# Patient Record
Sex: Female | Born: 1985 | Race: Black or African American | Hispanic: No | Marital: Single | Smoking: Former smoker
Health system: Southern US, Community
[De-identification: ages and names within clinical notes are randomized; demographics above are authoritative.]

## PROBLEM LIST (undated history)

## (undated) ENCOUNTER — Inpatient Hospital Stay (HOSPITAL_COMMUNITY): Payer: Self-pay

## (undated) DIAGNOSIS — K859 Acute pancreatitis without necrosis or infection, unspecified: Secondary | ICD-10-CM

## (undated) DIAGNOSIS — G43909 Migraine, unspecified, not intractable, without status migrainosus: Secondary | ICD-10-CM

## (undated) HISTORY — PX: CHOLECYSTECTOMY: SHX55

---

## 2011-03-26 ENCOUNTER — Inpatient Hospital Stay (HOSPITAL_COMMUNITY)
Admission: AD | Admit: 2011-03-26 | Discharge: 2011-03-26 | Disposition: A | Discharge status: Home / Self Care | Payer: Medicaid Other | Source: Ambulatory Visit | Attending: Obstetrics and Gynecology | Admitting: Obstetrics and Gynecology

## 2011-03-26 ENCOUNTER — Encounter (HOSPITAL_COMMUNITY): Payer: Self-pay | Admitting: *Deleted

## 2011-03-26 DIAGNOSIS — R1013 Epigastric pain: Secondary | ICD-10-CM | POA: Insufficient documentation

## 2011-03-26 DIAGNOSIS — O99891 Other specified diseases and conditions complicating pregnancy: Secondary | ICD-10-CM

## 2011-03-26 DIAGNOSIS — O9989 Other specified diseases and conditions complicating pregnancy, childbirth and the puerperium: Secondary | ICD-10-CM

## 2011-03-26 LAB — URINALYSIS, ROUTINE W REFLEX MICROSCOPIC
Glucose, UA: NEGATIVE mg/dL
Leukocytes, UA: NEGATIVE
pH: 5.5 (ref 5.0–8.0)

## 2011-03-26 MED ORDER — BETAMETHASONE SOD PHOS & ACET 6 (3-3) MG/ML IJ SUSP
12.0000 mg | Freq: Once | INTRAMUSCULAR | Status: AC
  Administered 2011-03-26: 12 mg via INTRAMUSCULAR
  Filled 2011-03-26: qty 2

## 2011-03-26 MED ORDER — BETAMETHASONE SOD PHOS & ACET 6 (3-3) MG/ML IJ SUSP: 12.0000 mg | Freq: Once | INTRAMUSCULAR | Status: DC

## 2011-03-26 NOTE — ED Provider Notes (Addendum)
History   pt is [redacted] weeks pregnant and was seen in the office with episodic right abdominal burning sensation.  Pt was dilated to 1 cm and had a positive FetalFibronectin from the office.  She is here for evaluation.    Chief Complaint  Patient presents with  . Contractions   HPI    Past Medical History  Diagnosis Date  . No pertinent past medical history     Past Surgical History  Procedure Date  . No past surgeries     Family History  Problem Relation Age of Onset  . Hypertension Father   . Cancer Maternal Grandmother     brain, lung  . Cancer Paternal Grandmother     breast    History  Substance Use Topics  . Smoking status: Former Smoker    Types: Cigarettes    Quit date: 09/25/2010  . Smokeless tobacco: Never Used  . Alcohol Use: No    Allergies:  Allergies  Allergen Reactions  . Depakote Hives  . Ibuprofen Hives, Itching and Rash    Prescriptions prior to admission  Medication Sig Dispense Refill  . Ibuprofen (ADVIL) 200 MG CAPS Take 2 capsules by mouth daily as needed.        . Prenatal Vit-Fe Fumarate-FA (PRENATAL LOW IRON PO) Take by mouth.        . promethazine (PHENERGAN) 25 MG tablet Take 25 mg by mouth daily as needed.          ROS Physical Exam  Pt is alert and oriented in no acute distress or discomfort Blood pressure 111/64, pulse 103, temperature 99 F (37.2 C), temperature source Oral, resp. rate 20, height 5\' 5"  (1.651 m), weight 233 lb 2 oz (105.745 kg).  Physical Exam  MAU Course  Procedures  MDM- pt is monitored with no contractions noted; FHR reactive; pt has had the same sensation while in MAU but no contractions palpated or noted on fetal monitor.

## 2011-03-26 NOTE — Progress Notes (Signed)
Pt states, " At 10:30 last night I started having pressure and tightening in my low abd with a burning sensations in my lower right abd. Today it is off and on and I am peeing a lot more. I have a Pos FFN and was dilated to 1 cm at approx 1200."

## 2011-03-27 ENCOUNTER — Inpatient Hospital Stay (HOSPITAL_COMMUNITY)
Admission: AD | Admit: 2011-03-27 | Discharge: 2011-03-27 | Disposition: A | Discharge status: Home / Self Care | Payer: Medicaid Other | Source: Ambulatory Visit | Attending: Obstetrics and Gynecology | Admitting: Obstetrics and Gynecology

## 2011-03-27 DIAGNOSIS — O4700 False labor before 37 completed weeks of gestation, unspecified trimester: Secondary | ICD-10-CM | POA: Insufficient documentation

## 2011-03-27 MED ORDER — BETAMETHASONE SOD PHOS & ACET 6 (3-3) MG/ML IJ SUSP
12.0000 mg | Freq: Once | INTRAMUSCULAR | Status: AC
  Administered 2011-03-27: 12 mg via INTRAMUSCULAR
  Filled 2011-03-27: qty 2

## 2011-03-27 NOTE — Progress Notes (Signed)
Patient here for second dose of Betamethasone, patient denies complaints

## 2011-04-07 LAB — ABO/RH: RH Type: POSITIVE

## 2011-04-07 LAB — ANTIBODY SCREEN: Antibody Screen: NEGATIVE

## 2011-04-27 ENCOUNTER — Inpatient Hospital Stay (HOSPITAL_COMMUNITY): Payer: Medicaid Other | Admitting: Anesthesiology

## 2011-04-27 ENCOUNTER — Encounter (HOSPITAL_COMMUNITY): Payer: Self-pay | Admitting: *Deleted

## 2011-04-27 ENCOUNTER — Encounter (HOSPITAL_COMMUNITY): Payer: Self-pay | Admitting: Anesthesiology

## 2011-04-27 ENCOUNTER — Inpatient Hospital Stay (HOSPITAL_COMMUNITY)
Admission: AD | Admit: 2011-04-27 | Discharge: 2011-04-30 | DRG: 775 | Disposition: A | Discharge status: Home / Self Care | Payer: Medicaid Other | Source: Ambulatory Visit | Attending: Obstetrics and Gynecology | Admitting: Obstetrics and Gynecology

## 2011-04-27 DIAGNOSIS — O4290 Premature rupture of membranes, unspecified as to length of time between rupture and onset of labor, unspecified weeks of gestation: Principal | ICD-10-CM | POA: Diagnosis present

## 2011-04-27 LAB — CBC
HCT: 35.3 % — ABNORMAL LOW (ref 36.0–46.0)
Hemoglobin: 11.5 g/dL — ABNORMAL LOW (ref 12.0–15.0)
MCHC: 32.6 g/dL (ref 30.0–36.0)
RBC: 4.37 MIL/uL (ref 3.87–5.11)

## 2011-04-27 LAB — RPR: RPR Ser Ql: NONREACTIVE

## 2011-04-27 MED ORDER — PHENYLEPHRINE 40 MCG/ML (10ML) SYRINGE FOR IV PUSH (FOR BLOOD PRESSURE SUPPORT): 80.0000 ug | PREFILLED_SYRINGE | INTRAVENOUS | Status: DC | PRN

## 2011-04-27 MED ORDER — LACTATED RINGERS IV SOLN
INTRAVENOUS | Status: DC
  Administered 2011-04-27 (×4): via INTRAVENOUS

## 2011-04-27 MED ORDER — OXYCODONE-ACETAMINOPHEN 5-325 MG PO TABS: 2.0000 | ORAL_TABLET | ORAL | Status: DC | PRN

## 2011-04-27 MED ORDER — ONDANSETRON HCL 4 MG/2ML IJ SOLN: 4.0000 mg | Freq: Four times a day (QID) | INTRAMUSCULAR | Status: DC | PRN

## 2011-04-27 MED ORDER — LIDOCAINE HCL 1.5 % IJ SOLN
INTRAMUSCULAR | Status: DC | PRN
  Administered 2011-04-27 (×2): 5 mL via EPIDURAL

## 2011-04-27 MED ORDER — FLEET ENEMA 7-19 GM/118ML RE ENEM: 1.0000 | ENEMA | RECTAL | Status: DC | PRN

## 2011-04-27 MED ORDER — COMPLETENATE 29-1 MG PO CHEW
1.0000 | CHEWABLE_TABLET | Freq: Every day | ORAL | Status: DC
  Administered 2011-04-30 (×2): 1 via ORAL
  Filled 2011-04-27 (×4): qty 1

## 2011-04-27 MED ORDER — EPHEDRINE 5 MG/ML INJ
INTRAVENOUS | Status: AC
  Filled 2011-04-27: qty 4

## 2011-04-27 MED ORDER — LACTATED RINGERS IV SOLN: 500.0000 mL | Freq: Once | INTRAVENOUS | Status: DC

## 2011-04-27 MED ORDER — EPHEDRINE 5 MG/ML INJ: 10.0000 mg | INTRAVENOUS | Status: DC | PRN

## 2011-04-27 MED ORDER — ACETAMINOPHEN 325 MG PO TABS: 650.0000 mg | ORAL_TABLET | ORAL | Status: DC | PRN

## 2011-04-27 MED ORDER — LIDOCAINE HCL (PF) 1 % IJ SOLN: 30.0000 mL | INTRAMUSCULAR | Status: DC | PRN

## 2011-04-27 MED ORDER — CITRIC ACID-SODIUM CITRATE 334-500 MG/5ML PO SOLN: 30.0000 mL | ORAL | Status: DC | PRN

## 2011-04-27 MED ORDER — OXYTOCIN 20 UNITS IN LACTATED RINGERS INFUSION - SIMPLE
125.0000 mL/h | INTRAVENOUS | Status: DC
  Filled 2011-04-27: qty 1000

## 2011-04-27 MED ORDER — LACTATED RINGERS IV SOLN
500.0000 mL | INTRAVENOUS | Status: DC | PRN
Start: 2011-04-27 — End: 2011-04-27

## 2011-04-27 MED ORDER — FENTANYL 2.5 MCG/ML BUPIVACAINE 1/10 % EPIDURAL INFUSION (WH - ANES)
INTRAMUSCULAR | Status: AC
  Filled 2011-04-27: qty 60

## 2011-04-27 MED ORDER — DIPHENHYDRAMINE HCL 50 MG/ML IJ SOLN: 12.5000 mg | INTRAMUSCULAR | Status: DC | PRN

## 2011-04-27 MED ORDER — OXYTOCIN BOLUS FROM INFUSION
500.0000 mL | Freq: Once | INTRAVENOUS | Status: DC
  Filled 2011-04-27: qty 500

## 2011-04-27 MED ORDER — FENTANYL 2.5 MCG/ML BUPIVACAINE 1/10 % EPIDURAL INFUSION (WH - ANES)
14.0000 mL/h | INTRAMUSCULAR | Status: DC
  Administered 2011-04-27 (×3): 14 mL/h via EPIDURAL
  Filled 2011-04-27 (×2): qty 60

## 2011-04-27 MED ORDER — PHENYLEPHRINE 40 MCG/ML (10ML) SYRINGE FOR IV PUSH (FOR BLOOD PRESSURE SUPPORT)
PREFILLED_SYRINGE | INTRAVENOUS | Status: AC
  Filled 2011-04-27: qty 5

## 2011-04-27 NOTE — Significant Event (Signed)
Fetal Strip from 0931am to 1023am is recorded under patient Sheri Cruz MR# [id number].

## 2011-04-27 NOTE — ED Provider Notes (Signed)
History     Chief Complaint  Patient presents with  . Labor Eval   HPI Asked to see patient for Sterile Speculum Exam to R/O Rupture of Membranes  Here for Labor Evaluation.  Past Medical History  Diagnosis Date  . No pertinent past medical history     Past Surgical History  Procedure Date  . No past surgeries     Family History  Problem Relation Age of Onset  . Hypertension Father   . Cancer Maternal Grandmother     brain, lung  . Cancer Paternal Grandmother     breast    History  Substance Use Topics  . Smoking status: Former Smoker    Types: Cigarettes    Quit date: 09/25/2010  . Smokeless tobacco: Never Used  . Alcohol Use: No    Allergies:  Allergies  Allergen Reactions  . Depakote Hives  . Ibuprofen Hives, Itching and Rash    Prescriptions prior to admission  Medication Sig Dispense Refill  . Prenatal Vit-Fe Fumarate-FA (PRENATAL LOW IRON PO) Take by mouth.        . Ibuprofen (ADVIL) 200 MG CAPS Take 2 capsules by mouth daily as needed.        . promethazine (PHENERGAN) 25 MG tablet Take 25 mg by mouth daily as needed.          ROS C/O leaking clear fluid and contractions  Physical Exam   Blood pressure 130/72, pulse 102, temperature 97.9 F (36.6 C), temperature source Oral, resp. rate 18, height 5\' 5"  (1.651 m), weight 107.14 kg (236 lb 3.2 oz).  Physical Exam Speculum:  Small amount of Pooling.  + Ferning seen Cervix 3/90/-1/vtx with bloody show. FHR reassuring but not reactive by criteria  MAU Course  Procedures  MDM  Assessment and Plan  Will notify Dr Richardson Dopp for Admission  [facility] 04/27/2011, 8:44 AM

## 2011-04-27 NOTE — Progress Notes (Signed)
Contractions started a 0300, regulart 0600.  2-5. G1.  Received steroid shots.  3cm last week.  Leaking fluid - noted at 0300, still coming.

## 2011-04-27 NOTE — Progress Notes (Signed)
Operative Delivery Note At 9:45 PM a viable female was delivered via Vaginal, Vacuum Investment banker, operational).  Presentation: vertex; Position: Right,, Occiput,, Posterior; Station: +2.  Verbal consent: obtained from patient.  Risks and benefits discussed in detail.  Risks include, but are not limited to the risks of anesthesia, bleeding, infection, damage to maternal tissues, fetal cephalhematoma.  There is also the risk of inability to effect vaginal delivery of the head, or shoulder dystocia that cannot be resolved by established maneuvers, leading to the need for emergency cesarean section.  APGAR: 9, 9; weight 5 lb 8.7 oz (2515 g).   Placenta status: , .   Cord: 3 vessels with the following complications: None.  Cord pH: not sent  Anesthesia: Epidural  Instruments: vacuum extraction  Episiotomy: Median Lacerations: None Suture Repair: 2.0 chromic Est. Blood Loss (mL):   Mom to postpartum.  Baby to nursery-stable.  Fortino Sic 04/27/2011, 10:24 PM

## 2011-04-27 NOTE — Anesthesia Procedure Notes (Signed)
Epidural Patient location during procedure: OB Start time: 04/27/2011 11:13 AM  Staffing Performed by: anesthesiologist   Preanesthetic Checklist Completed: patient identified, site marked, surgical consent, pre-op evaluation, timeout performed, IV checked, risks and benefits discussed and monitors and equipment checked  Epidural Patient position: sitting Prep: site prepped and draped and DuraPrep Patient monitoring: continuous pulse ox and blood pressure Approach: midline Injection technique: LOR air and LOR saline  Needle:  Needle type: Tuohy  Needle gauge: 17 G Needle length: 9 cm Needle insertion depth: 7 cm Catheter type: closed end flexible Catheter size: 19 Gauge Catheter at skin depth: 12 cm Test dose: negative  Assessment Events: blood not aspirated, injection not painful, no injection resistance, negative IV test and no paresthesia  Additional Notes Patient identified.  Risk benefits discussed including failed block, incomplete pain control, headache, nerve damage, paralysis, blood pressure changes, nausea, vomiting, reactions to medication both toxic or allergic, and postpartum back pain.  Patient expressed understanding and wished to proceed.  All questions were answered.  Sterile technique used throughout procedure and epidural site dressed with sterile barrier dressing. No paresthesia or other complications noted.The patient did not experience any signs of intravascular injection such as tinnitus or metallic taste in mouth nor signs of intrathecal spread such as rapid motor block. Please see nursing notes for vital signs.

## 2011-04-27 NOTE — Anesthesia Preprocedure Evaluation (Signed)
Anesthesia Evaluation  Name, MR# and DOB Patient awake  General Assessment Comment  Reviewed: Allergy & Precautions, H&P , Patient's Chart, lab work & pertinent test results  Airway Mallampati: III TM Distance: >3 FB Neck ROM: full    Dental No notable dental hx.    Pulmonary  clear to auscultation  pulmonary exam normalPulmonary Exam Normal breath sounds clear to auscultation none    Cardiovascular regular Normal    Neuro/Psych Negative Neurological ROS  Negative Psych ROS  GI/Hepatic/Renal negative GI ROS, negative Liver ROS, and negative Renal ROS (+)       Endo/Other  Negative Endocrine ROS (+)   Morbid obesity  Abdominal   Musculoskeletal   Hematology negative hematology ROS (+)   Peds  Reproductive/Obstetrics (+) Pregnancy    Anesthesia Other Findings             Anesthesia Physical Anesthesia Plan  ASA: III  Anesthesia Plan: Epidural   Post-op Pain Management:    Induction:   Airway Management Planned:   Additional Equipment:   Intra-op Plan:   Post-operative Plan:   Informed Consent: I have reviewed the patients History and Physical, chart, labs and discussed the procedure including the risks, benefits and alternatives for the proposed anesthesia with the patient or authorized representative who has indicated his/her understanding and acceptance.     Plan Discussed with:   Anesthesia Plan Comments:         Anesthesia Quick Evaluation

## 2011-04-27 NOTE — Progress Notes (Signed)
Kenny Stern is a 25 y.o. G1P0 at [redacted]w[redacted]d by ultrasound admitted for active labor, PROM  Subjective:  No complaints   Objective: BP 120/75  Pulse 95  Temp(Src) 97.9 F (36.6 C) (Oral)  Resp 20  Ht 5\' 3"  (1.6 m)  Wt 107.049 kg (236 lb)  BMI 41.81 kg/m2  SpO2 100%      FHT:  FHR: 130 bpm, variability: marked,  accelerations:  Present,  decelerations:  Absent UC:   regular, every 3-4 minutes SVE:   Dilation: 10 Effacement (%): 100 Station: 0;+1 Exam by:: Dr Neva Seat  Labs: Lab Results  Component Value Date   WBC 11.0* 04/27/2011   HGB 11.5* 04/27/2011   HCT 35.3* 04/27/2011   MCV 80.8 04/27/2011   PLT 239 04/27/2011    Assessment / Plan: Spontaneous labor, progressing normally  Labor: Progressing normally Fetal Wellbeing:  Category I Pain Control:  Epidural Anticipated MOD:  NSVD  GREENE,ELEANOR E 04/27/2011, 8:59 PM

## 2011-04-28 LAB — COMPREHENSIVE METABOLIC PANEL
ALT: 47 U/L — ABNORMAL HIGH (ref 0–35)
Albumin: 2.3 g/dL — ABNORMAL LOW (ref 3.5–5.2)
Alkaline Phosphatase: 206 U/L — ABNORMAL HIGH (ref 39–117)
GFR calc Af Amer: >60 mL/min (ref >60)
Glucose, Bld: 79 mg/dL (ref 70–99)
Potassium: 3.7 mEq/L (ref 3.5–5.1)
Sodium: 136 mEq/L (ref 135–145)
Total Protein: 5.7 g/dL — ABNORMAL LOW (ref 6.0–8.3)

## 2011-04-28 LAB — ABO/RH: ABO/RH(D): O POS

## 2011-04-28 LAB — CBC
Hemoglobin: 9.6 g/dL — ABNORMAL LOW (ref 12.0–15.0)
MCHC: 31.7 g/dL (ref 30.0–36.0)
RBC: 3.75 MIL/uL — ABNORMAL LOW (ref 3.87–5.11)
WBC: 16.4 10*3/uL — ABNORMAL HIGH (ref 4.0–10.5)

## 2011-04-28 MED ORDER — SENNOSIDES-DOCUSATE SODIUM 8.6-50 MG PO TABS
2.0000 | ORAL_TABLET | Freq: Every day | ORAL | Status: DC
  Administered 2011-04-28: 1 via ORAL
  Administered 2011-04-29: 2 via ORAL

## 2011-04-28 MED ORDER — BENZOCAINE-MENTHOL 20-0.5 % EX AERO
1.0000 "application " | INHALATION_SPRAY | CUTANEOUS | Status: DC | PRN
  Administered 2011-04-28: 1 via TOPICAL

## 2011-04-28 MED ORDER — ZOLPIDEM TARTRATE 5 MG PO TABS: 5.0000 mg | ORAL_TABLET | Freq: Every evening | ORAL | Status: DC | PRN

## 2011-04-28 MED ORDER — OXYTOCIN 20 UNITS IN LACTATED RINGERS INFUSION - SIMPLE: 125.0000 mL/h | INTRAVENOUS | Status: DC | PRN

## 2011-04-28 MED ORDER — SIMETHICONE 80 MG PO CHEW: 80.0000 mg | CHEWABLE_TABLET | ORAL | Status: DC | PRN

## 2011-04-28 MED ORDER — DIPHENHYDRAMINE HCL 25 MG PO CAPS: 25.0000 mg | ORAL_CAPSULE | Freq: Four times a day (QID) | ORAL | Status: DC | PRN

## 2011-04-28 MED ORDER — ONDANSETRON HCL 4 MG/2ML IJ SOLN: 4.0000 mg | INTRAMUSCULAR | Status: DC | PRN

## 2011-04-28 MED ORDER — ONDANSETRON HCL 4 MG PO TABS: 4.0000 mg | ORAL_TABLET | ORAL | Status: DC | PRN

## 2011-04-28 MED ORDER — TETANUS-DIPHTH-ACELL PERTUSSIS 5-2.5-18.5 LF-MCG/0.5 IM SUSP
0.5000 mL | Freq: Once | INTRAMUSCULAR | Status: AC
  Administered 2011-04-30: 0.5 mL via INTRAMUSCULAR
  Filled 2011-04-28: qty 0.5

## 2011-04-28 MED ORDER — DIBUCAINE 1 % RE OINT: 1.0000 "application " | TOPICAL_OINTMENT | RECTAL | Status: DC | PRN

## 2011-04-28 MED ORDER — MEDROXYPROGESTERONE ACETATE 150 MG/ML IM SUSP: 150.0000 mg | INTRAMUSCULAR | Status: DC | PRN

## 2011-04-28 MED ORDER — BENZOCAINE-MENTHOL 20-0.5 % EX AERO
INHALATION_SPRAY | CUTANEOUS | Status: AC
  Administered 2011-04-28: 1 via TOPICAL
  Filled 2011-04-28: qty 56

## 2011-04-28 MED ORDER — PRENATAL PLUS 27-1 MG PO TABS
1.0000 | ORAL_TABLET | Freq: Every day | ORAL | Status: DC
  Administered 2011-04-28 – 2011-04-29 (×2): 1 via ORAL
  Filled 2011-04-28 (×2): qty 1

## 2011-04-28 MED ORDER — LANOLIN HYDROUS EX OINT: TOPICAL_OINTMENT | CUTANEOUS | Status: DC | PRN

## 2011-04-28 MED ORDER — WITCH HAZEL-GLYCERIN EX PADS: 1.0000 "application " | MEDICATED_PAD | CUTANEOUS | Status: DC | PRN

## 2011-04-28 MED ORDER — MEASLES, MUMPS & RUBELLA VAC ~~LOC~~ INJ
0.5000 mL | INJECTION | Freq: Once | SUBCUTANEOUS | Status: DC
  Filled 2011-04-28: qty 0.5

## 2011-04-28 MED ORDER — OXYCODONE-ACETAMINOPHEN 5-325 MG PO TABS
1.0000 | ORAL_TABLET | ORAL | Status: DC | PRN
  Administered 2011-04-28 – 2011-04-30 (×6): 1 via ORAL
  Filled 2011-04-28 (×3): qty 1
  Filled 2011-04-28: qty 2
  Filled 2011-04-28: qty 1
  Filled 2011-04-28: qty 2
  Filled 2011-04-28: qty 1

## 2011-04-28 NOTE — Progress Notes (Signed)
UR chart review completed.  

## 2011-04-28 NOTE — H&P (Signed)
NAME:  Sheri Cruz, Sheri Cruz NO.:  [id number]  MEDICAL RECORD NO.:  [id number]  LOCATION:  9172                          FACILITY:  [facility]  PHYSICIAN:  Arlyce Harman, MD     DATE OF BIRTH:  24-Mar-1986  DATE OF ADMISSION:  04/27/2011 DATE OF DISCHARGE:                             HISTORY & PHYSICAL   CHIEF COMPLAINT:  36-1/2 weeks' pregnant, prematurely ruptured membranes, preterm labor.  HISTORY OF PRESENT ILLNESS:  The patient is a 25 year old gravida 1, para 0 whose due date was May 21, 2011.  Prenatal course was remarkable for history of tobacco use, however, the patient quit earlier in her pregnancy.  Also has a history.  The patient also has a history of anemia which has been treated with iron and prenatal vitamins.  The patient transferred her care to [facility] in the third trimester and prenatal course had been uneventful until April 20, 2011 when the patient complained of pelvic pressure and cramping.  At that time, the cervix was checked and she was 2-3 cm dilated, 50% effaced, and -3 station.  Group B beta strep was obtained and is negative. Fetofibronectin was sent and this was positive.  The patient had previously been given antenatal steroids on March 26, 2011 when she presented to labor and delivery with contractions.  The patient was then in her usual state of health until this morning when she complained of painful contractions and leaking fluid.  She was instructed to come into Labor and Delivery, was found to be in labor with ruptured membranes.  SOCIAL HISTORY:  The patient is single.  There is a past history of tobacco use.  ILLNESSES:  The patient has a history of epilepsy.  ALLERGIES:  To DEPAKOTE and IBUPROFEN.  FAMILY HISTORY:  High blood pressure in father.  REVIEW OF SYSTEMS:  Noncontributory.  PHYSICAL EXAMINATION:  GENERAL: Well-developed pregnant female in no acute distress. HEENT:  Normal. CHEST:  Clear to  auscultation and percussion. HEART:  S1-S2 is present and without murmur, gallop, or rub.  Bowel sounds normal.  Fundal height is appropriate for gestation. EXTREMITIES:  Normal without swelling and edema. PELVIS:  Cervix 3 cm dilated, 70% effaced and -1 with documented ruptured membranes.  ASSESSMENT:  36-1/2 weeks' pregnant, prematurely ruptured membranes, preterm labor, group B beta strep negative.  PLAN:  Expectant care.     Arlyce Harman, MD     EG/MEDQ  D:  04/27/2011  T:  04/28/2011  Job:  [id number]

## 2011-04-28 NOTE — Anesthesia Postprocedure Evaluation (Signed)
  Anesthesia Post-op Note  Patient: Sheri Cruz  Procedure(s) Performed: * No procedures listed *  Patient Location: PACU and Mother/Baby  Anesthesia Type: Epidural  Level of Consciousness: awake, alert  and oriented  Airway and Oxygen Therapy: Patient Spontanous Breathing  Post-op Assessment: Post-op Vital signs reviewed and Patient's Cardiovascular Status Stable  Post-op Vital Signs: Reviewed and stable  Complications: No apparent anesthesia complications

## 2011-04-29 NOTE — Progress Notes (Signed)
Post Partum Day 2 Subjective: no complaints, up ad lib, voiding, tolerating PO and + flatus  Objective: Blood pressure 104/68, pulse 82, temperature 98 F (36.7 C), temperature source Oral, resp. rate 18, height 5\' 3"  (1.6 m), weight 107.049 kg (236 lb), SpO2 100.00%, unknown if currently breastfeeding.  Physical Exam:  General: alert and cooperative Lochia: appropriate Uterine Fundus: firm Incision: healing well DVT Evaluation: No evidence of DVT seen on physical exam.   Basename 04/28/11 0535 04/27/11 0940  HGB 9.6* 11.5*  HCT 30.3* 35.3*    Assessment/Plan: Plan for discharge tomorrow Patient is 48 hours postpartum late tonight, and the baby is not ready for discharge, so will plan for discharge tomorrow or Saturday. Planning Paragaurd IUD for birth control.  Breast and bottle feeding.   LOS: 2 days   Carmichael Burdette MILSAW 04/29/2011, 8:56 AM

## 2011-04-30 MED ORDER — BENZOCAINE-MENTHOL 20-0.5 % EX AERO
INHALATION_SPRAY | CUTANEOUS | Status: AC
  Filled 2011-04-30: qty 56

## 2011-04-30 NOTE — Progress Notes (Signed)
Post Partum Day 3 Subjective: no complaints, up ad lib, voiding, tolerating PO and + flatus  Objective: Blood pressure 112/72, pulse 94, temperature 97.9 F (36.6 C), temperature source Oral, resp. rate 18, height 5\' 3"  (1.6 m), weight 107.049 kg (236 lb), SpO2 97.00%, unknown if currently breastfeeding.  Physical Exam:  General: alert and cooperative Lochia: appropriate Uterine Fundus: firm Incision: healing well DVT Evaluation: No evidence of DVT seen on physical exam. Negative Homan's sign.   Basename 04/28/11 0535 04/27/11 0940  HGB 9.6* 11.5*  HCT 30.3* 35.3*    Assessment/Plan: Discharge home Breast feeding and pumping.  Desires Micronor and Paragaurd IUD for birth control   LOS: 3 days   Sheri Cruz MILSAW 04/30/2011, 9:16 AM

## 2011-04-30 NOTE — Consult Note (Signed)
Mom's breasts filling with hard areas and leaking copious amounts causing discomfort.  Demonstrated how to massage breasts while baby breastfeeding to completely drain breasts.  May need to prepump and wear shells between feedings.

## 2011-04-30 NOTE — Progress Notes (Addendum)
Obstetric Discharge Summary Reason for Admission: onset of labor Prenatal Procedures: none Intrapartum Procedures: vacuum and episiotomy  Postpartum Procedures: none Complications-Operative and Postpartum: none  Hemoglobin  Date Value Range Status  04/28/2011 9.6* 12.0-15.0 (g/dL) Final     HCT  Date Value Range Status  04/28/2011 30.3* 36.0-46.0 (%) Final    Discharge Diagnoses: Premature labor  Discharge Information: Date: 04/30/2011 Activity: pelvic rest Diet: routine Medications: Percocet, micronor Condition: stable Instructions: refer to practice specific booklet Discharge to: home   Newborn Data: Live born  Information for the patient's newborn:  Fabiola, Mudgett [[id number]]  female  Home with mother tonight or tomorrow morning after ready for discharge.  Liberty Handy MILSAW 04/30/2011, 9:17 AM

## 2011-05-03 NOTE — Discharge Summary (Signed)
Obstetric Discharge Summary  Reason for Admission: onset of labor  Prenatal Procedures: none  Intrapartum Procedures: vacuum and episiotomy  Postpartum Procedures: none  Complications-Operative and Postpartum: none  Hemoglobin   Date  Value  Range  Status   04/28/2011  9.6*  12.0-15.0 (g/dL)  Final      HCT   Date  Value  Range  Status   04/28/2011  30.3*  36.0-46.0 (%)  Final    Discharge Diagnoses: Premature labor  Discharge Information:  Date: 04/30/2011  Activity: pelvic rest  Diet: routine  Medications: Percocet, micronor  Condition: stable  Instructions: refer to practice specific booklet  Discharge to: home   Newborn Data: Live born  Information for the patient's newborn:  Aleigh, Grunden [[id number]]   female   Home with mother tonight or tomorrow morning after ready for discharge.  Liberty Handy MILSAW  04/30/2011, 9:17 AM   Written on 04/30/11, mislabeled as "progress note", now rewritten as "discharge note"

## 2011-05-09 NOTE — Progress Notes (Signed)
Post Partum Day 2 Subjective: no complaints  Objective: Blood pressure 112/72, pulse 94, temperature 97.9 F (36.6 C), temperature source Oral, resp. rate 18, height 5\' 3"  (1.6 m), weight 107.049 kg (236 lb), SpO2 97.00%, unknown if currently breastfeeding.  Physical Exam:  General: alert Lochia: appropriate Uterine Fundus: firm Episiotomy, laceration : na DVT Evaluation: No evidence of DVT seen on physical exam.  No results found for this basename: HGB:2,HCT:2 in the last 72 hours  Assessment/Plan: Stable P: routine   LOS: 3 days   Sheri Cruz E 05/09/2011, 12:55 PM

## 2011-06-15 ENCOUNTER — Other Ambulatory Visit (HOSPITAL_COMMUNITY)
Admission: RE | Admit: 2011-06-15 | Discharge: 2011-06-15 | Disposition: A | Discharge status: Home / Self Care | Payer: Medicaid Other | Source: Ambulatory Visit | Attending: Obstetrics and Gynecology | Admitting: Obstetrics and Gynecology

## 2011-06-15 DIAGNOSIS — Z124 Encounter for screening for malignant neoplasm of cervix: Secondary | ICD-10-CM | POA: Insufficient documentation

## 2011-10-19 ENCOUNTER — Inpatient Hospital Stay (HOSPITAL_COMMUNITY)
Admission: AD | Admit: 2011-10-19 | Discharge: 2011-10-19 | Disposition: A | Discharge status: Home / Self Care | Payer: Self-pay | Source: Ambulatory Visit | Attending: Obstetrics & Gynecology | Admitting: Obstetrics & Gynecology

## 2011-10-19 ENCOUNTER — Encounter (HOSPITAL_COMMUNITY): Payer: Self-pay | Admitting: *Deleted

## 2011-10-19 ENCOUNTER — Inpatient Hospital Stay (HOSPITAL_COMMUNITY): Payer: Self-pay

## 2011-10-19 ENCOUNTER — Emergency Department (HOSPITAL_COMMUNITY): Admission: EM | Admit: 2011-10-19 | Payer: Self-pay | Source: Home / Self Care

## 2011-10-19 DIAGNOSIS — O209 Hemorrhage in early pregnancy, unspecified: Secondary | ICD-10-CM

## 2011-10-19 DIAGNOSIS — O039 Complete or unspecified spontaneous abortion without complication: Secondary | ICD-10-CM | POA: Insufficient documentation

## 2011-10-19 LAB — DIFFERENTIAL
Basophils Absolute: 0 10*3/uL (ref 0.0–0.1)
Basophils Relative: 0 % (ref 0–1)
Eosinophils Absolute: 0.1 10*3/uL (ref 0.0–0.7)
Monocytes Absolute: 0.5 10*3/uL (ref 0.1–1.0)
Neutro Abs: 6.6 10*3/uL (ref 1.7–7.7)
Neutrophils Relative %: 76 % (ref 43–77)

## 2011-10-19 LAB — WET PREP, GENITAL
Clue Cells Wet Prep HPF POC: NONE SEEN
WBC, Wet Prep HPF POC: NONE SEEN
Yeast Wet Prep HPF POC: NONE SEEN

## 2011-10-19 LAB — CBC
MCH: 24.9 pg — ABNORMAL LOW (ref 26.0–34.0)
MCHC: 31.6 g/dL (ref 30.0–36.0)
RDW: 15.3 % (ref 11.5–15.5)

## 2011-10-19 LAB — ABO/RH: ABO/RH(D): O POS

## 2011-10-19 MED ORDER — HYDROMORPHONE HCL PF 1 MG/ML IJ SOLN
1.0000 mg | Freq: Once | INTRAMUSCULAR | Status: AC
  Administered 2011-10-19: 1 mg via INTRAVENOUS
  Filled 2011-10-19: qty 1

## 2011-10-19 MED ORDER — LACTATED RINGERS IV SOLN
INTRAVENOUS | Status: DC
  Administered 2011-10-19: 06:00:00 via INTRAVENOUS

## 2011-10-19 MED ORDER — ONDANSETRON HCL 4 MG/2ML IJ SOLN
4.0000 mg | Freq: Once | INTRAMUSCULAR | Status: AC
  Administered 2011-10-19: 4 mg via INTRAVENOUS
  Filled 2011-10-19: qty 2

## 2011-10-19 NOTE — ED Provider Notes (Signed)
History     CSN: [id number]  Arrival date & time 10/19/11  0346   None     No chief complaint on file.  HPI Sheri Cruz is a 26 y.o. female who presents to MAU for bleeding in early pregnancy. Woke this am with heavy bleeding, passing clots and severe cramping. Just found out last week that she was pregnant. Went to an ED in [facility] because she was feeling weak and had a positive pregnancy test. The history was provided by the patient.  History reviewed. No pertinent past medical history.  History reviewed. No pertinent past surgical history.  Family History  Problem Relation Age of Onset  . Hypertension Father   . Cancer Maternal Grandmother     brain, lung  . Cancer Paternal Grandmother     breast    History  Substance Use Topics  . Smoking status: Former Smoker    Types: Cigarettes    Quit date: 09/25/2010  . Smokeless tobacco: Never Used  . Alcohol Use: No    OB History    Grav Para Term Preterm Abortions TAB SAB Ect Mult Living   2 1  1      1       Review of Systems  Constitutional: Positive for fatigue. Negative for fever and chills.  HENT: Negative.   Eyes: Negative.   Respiratory: Negative.   Cardiovascular: Negative.   Gastrointestinal: Positive for nausea and abdominal pain. Negative for diarrhea and constipation.  Genitourinary: Positive for frequency, vaginal bleeding and pelvic pain. Negative for dysuria and urgency.  Musculoskeletal: Positive for back pain.  Neurological: Negative.   Psychiatric/Behavioral: Negative for confusion and agitation.    Allergies  Depakote and Ibuprofen  Home Medications  No current outpatient prescriptions on file.  BP 115/64  Pulse 93  Temp(Src) 98.2 F (36.8 C) (Oral)  Resp 20  Ht 5\' 7"  (1.702 m)  Wt 223 lb 4 oz (101.266 kg)  BMI 34.97 kg/m2  LMP 08/01/2011  Breastfeeding? Unknown  Physical Exam  Nursing note and vitals reviewed. Constitutional: She is oriented to person, place, and time. She  appears well-developed and well-nourished.  HENT:  Head: Normocephalic.  Eyes: EOM are normal.  Neck: Neck supple.  Cardiovascular: Normal rate.   Pulmonary/Chest: Effort normal.  Abdominal: Soft. There is no tenderness.  Genitourinary:       External genitalia without lesions. Heavy vaginal bleeding, large clots vaginal vault. Cervix closed, positive CMT, bilateral adnexal tenderness. Uterus enlarged and tender on exam  Musculoskeletal: Normal range of motion.  Neurological: She is alert and oriented to person, place, and time. No cranial nerve deficit.  Skin: Skin is warm and dry.  Psychiatric: She has a normal mood and affect. Her behavior is normal. Judgment and thought content normal.   Results for orders placed during the hospital encounter of 10/19/11 (from the past 24 hour(s))  POCT PREGNANCY, URINE     Status: Abnormal   Collection Time   10/19/11  4:11 AM      Component Value Range   Preg Test, Ur POSITIVE (*) NEGATIVE   WET PREP, GENITAL     Status: Normal   Collection Time   10/19/11  4:53 AM      Component Value Range   Yeast Wet Prep HPF POC NONE SEEN  NONE SEEN    Trich, Wet Prep NONE SEEN  NONE SEEN    Clue Cells Wet Prep HPF POC NONE SEEN  NONE SEEN    WBC,  Wet Prep HPF POC NONE SEEN  NONE SEEN   CBC     Status: Abnormal   Collection Time   10/19/11  5:00 AM      Component Value Range   WBC 8.7  4.0 - 10.5 (K/uL)   RBC 4.42  3.87 - 5.11 (MIL/uL)   Hemoglobin 11.0 (*) 12.0 - 15.0 (g/dL)   HCT 04.5 (*) 40.9 - 46.0 (%)   MCV 78.7  78.0 - 100.0 (fL)   MCH 24.9 (*) 26.0 - 34.0 (pg)   MCHC 31.6  30.0 - 36.0 (g/dL)   RDW 81.1  91.4 - 78.2 (%)   Platelets 303  150 - 400 (K/uL)  DIFFERENTIAL     Status: Normal   Collection Time   10/19/11  5:00 AM      Component Value Range   Neutrophils Relative 76  43 - 77 (%)   Neutro Abs 6.6  1.7 - 7.7 (K/uL)   Lymphocytes Relative 17  12 - 46 (%)   Lymphs Abs 1.5  0.7 - 4.0 (K/uL)   Monocytes Relative 5  3 - 12 (%)   Monocytes  Absolute 0.5  0.1 - 1.0 (K/uL)   Eosinophils Relative 1  0 - 5 (%)   Eosinophils Absolute 0.1  0.0 - 0.7 (K/uL)   Basophils Relative 0  0 - 1 (%)   Basophils Absolute 0.0  0.0 - 0.1 (K/uL)  HCG, QUANTITATIVE, PREGNANCY     Status: Abnormal   Collection Time   10/19/11  5:00 AM      Component Value Range   hCG, Beta Chain, Quant, S 24270 (*) <5 (mIU/mL)  ABO/RH     Status: Normal   Collection Time   10/19/11  5:00 AM      Component Value Range   ABO/RH(D) O POS     US Ob Comp Less 14 Wks  10/19/2011  *RADIOLOGY REPORT*  Clinical Data: Bleeding  OBSTETRIC <14 WK [place] AND TRANSVAGINAL OB US  Technique:  Both transabdominal and transvaginal ultrasound examinations were performed for complete evaluation of the gestation as well as the maternal uterus, adnexal regions, and pelvic cul-de-sac.  Transvaginal technique was performed to assess early pregnancy.  Comparison:  None.  Intrauterine gestational sac: Not identified  Maternal uterus/Adnexae: Normal sonographic appearance to the ovaries.  The lower uterine segment/cervical canal is distended with internal blood clot/debris.  No free fluid identified within the pelvis.  IMPRESSION: No intrauterine gestation identified.  Blood clot/debris within the lower uterine segment/cervical canal. Correlate with serial beta HCG to exclude retained products of conception.  Original Report Authenticated By: Waneta Martins, M.D.   US Ob Transvaginal  10/19/2011  *RADIOLOGY REPORT*  Clinical Data: Bleeding  OBSTETRIC <14 WK [place] AND TRANSVAGINAL OB US  Technique:  Both transabdominal and transvaginal ultrasound examinations were performed for complete evaluation of the gestation as well as the maternal uterus, adnexal regions, and pelvic cul-de-sac.  Transvaginal technique was performed to assess early pregnancy.  Comparison:  None.  Intrauterine gestational sac: Not identified  Maternal uterus/Adnexae: Normal sonographic appearance to the ovaries.  The lower uterine  segment/cervical canal is distended with internal blood clot/debris.  No free fluid identified within the pelvis.  IMPRESSION: No intrauterine gestation identified.  Blood clot/debris within the lower uterine segment/cervical canal. Correlate with serial beta HCG to exclude retained products of conception.  Original Report Authenticated By: Waneta Martins, M.D.   Assessment: SAB  Plan:  IV LR   Dilaudid  1 mg IV   Zofran 4 mg IV   GC, Chlamydia cultures pending  Re evaluation: patient returned from ultrasound and is feeling much better. While in ultrasound she passed a large clot and ? Tissue but flushed it down the toilet. Bleeding small amount at this time and minimal cramping.  ED Course: discussed clinical, lab and ultrasound results with Dr. Debroah Loop. Will discharge patient home to return here in one week for recheck and follow up ultrasound to be sure every thing has passed.   Procedures   Return in one week for recheck. Return sooner for problems.   MDM          Kerrie Buffalo, NP 10/19/11 [phone]

## 2011-10-19 NOTE — Progress Notes (Signed)
SMAALL DARK 2 INCH   LINE OF BLOOD ON TOWEL.  IN TRIAGE

## 2011-10-19 NOTE — Progress Notes (Signed)
PT  SAYS SHE WENT TO ER IN [place]   LAST MON- TOLD 10 WEEKS 1 DAY BY LMP. NO BLEEDING OR CRAMPING.  SAYS  STARTED SPOTTING ON Sunday NIGHT- THOUGHT IT WAS HEMORR. STOPPED.   SAYS BLEEDING STARTED AGAIN AT [place]-  PASSED CLOTS-  STRINGY. CRAMPS STARTED AT 0300. Marland Kitchen    DOES NOT HAVE DR.

## 2011-10-20 LAB — GC/CHLAMYDIA PROBE AMP, GENITAL
Chlamydia, DNA Probe: NEGATIVE
GC Probe Amp, Genital: NEGATIVE

## 2013-11-13 IMAGING — US US OB COMP LESS 14 WK
1 series · 14 of 28 positions shown · non-contrast
Comparison: None.

CLINICAL DATA: Bleeding

OBSTETRIC <14 WK US AND TRANSVAGINAL OB US
TECHNIQUE: Both transabdominal and transvaginal ultrasound
examinations were performed for complete evaluation of the
gestation as well as the maternal uterus, adnexal regions, and
pelvic cul-de-sac.  Transvaginal technique was performed to assess
early pregnancy.

[Series 1: us ob comp less 14 wks · 14 of 43 slices shown]
[im 2/43]
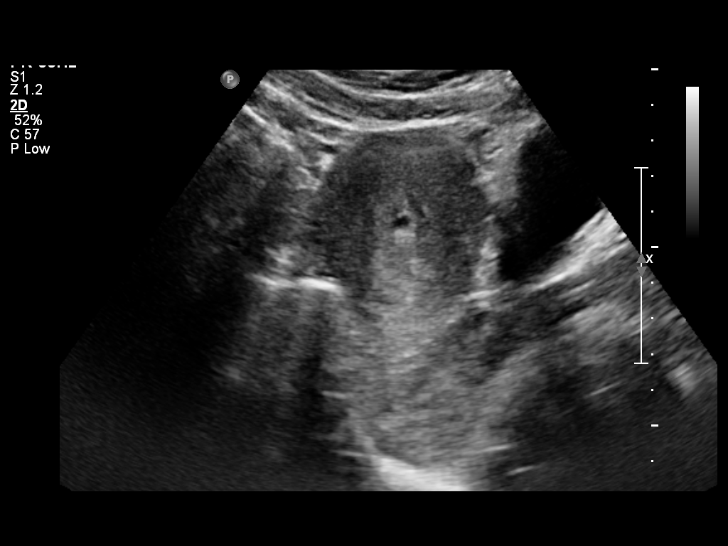
[im 5/43]
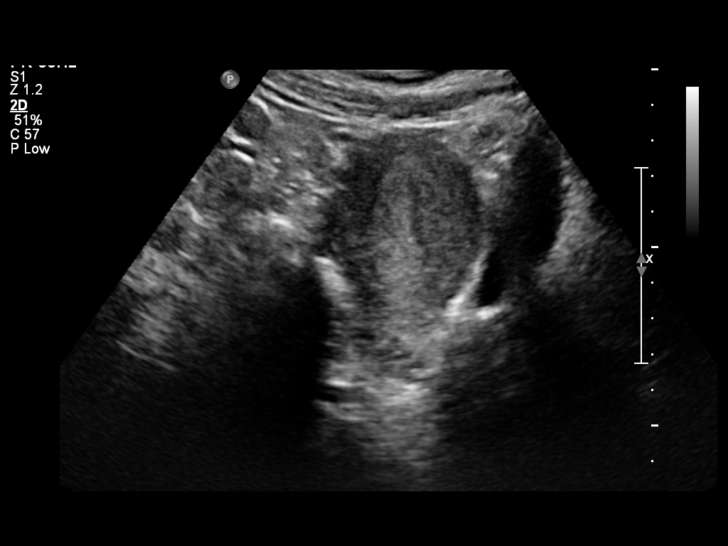
[im 8/43]
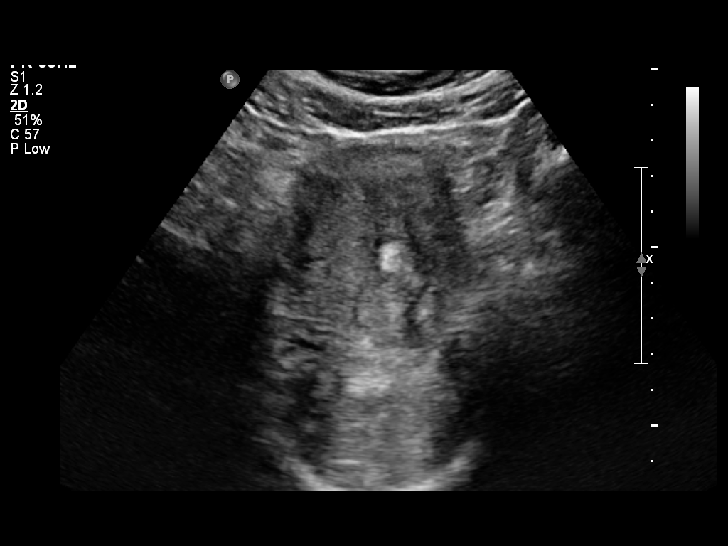
[im 11/43]
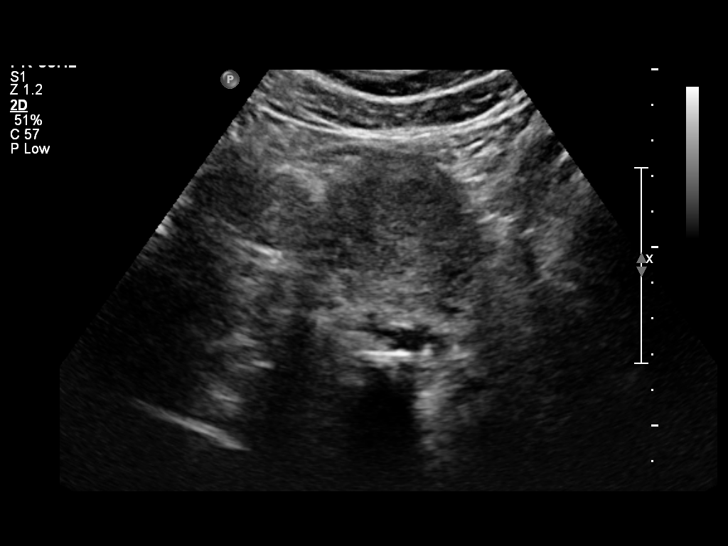
[im 15/43]
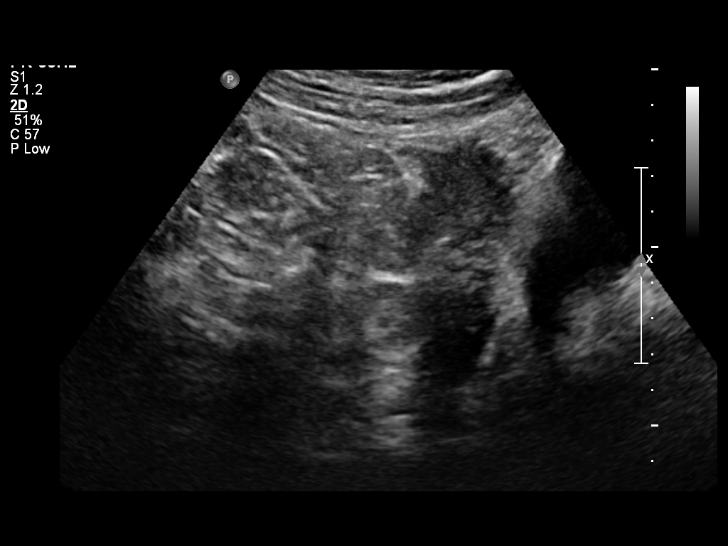
[im 18/43]
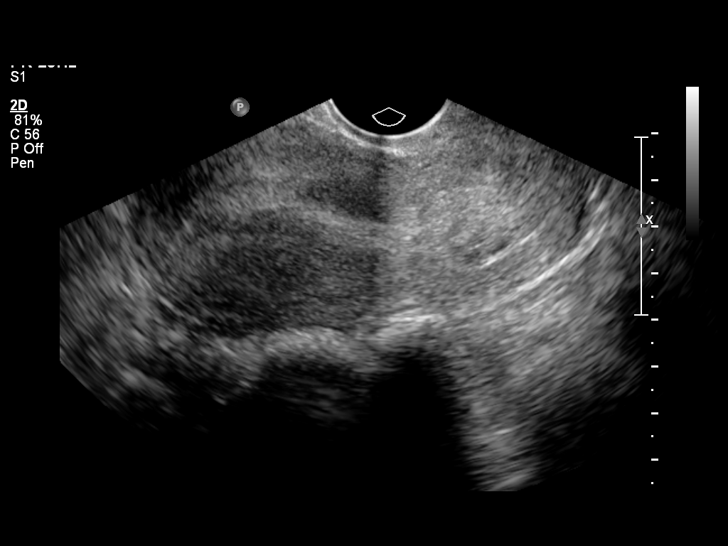
[im 21/43]
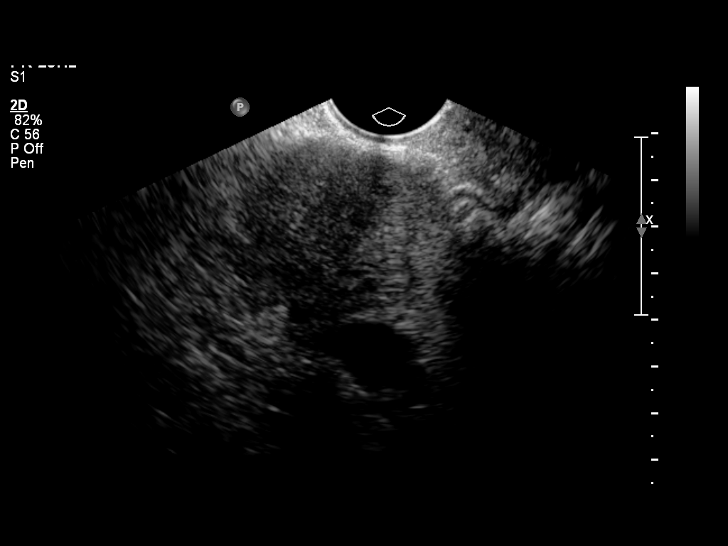
[im 24/43]
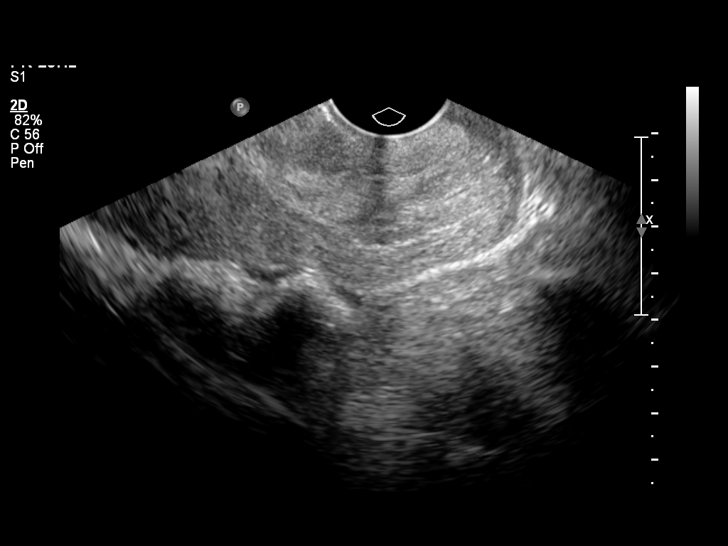
[im 27/43]
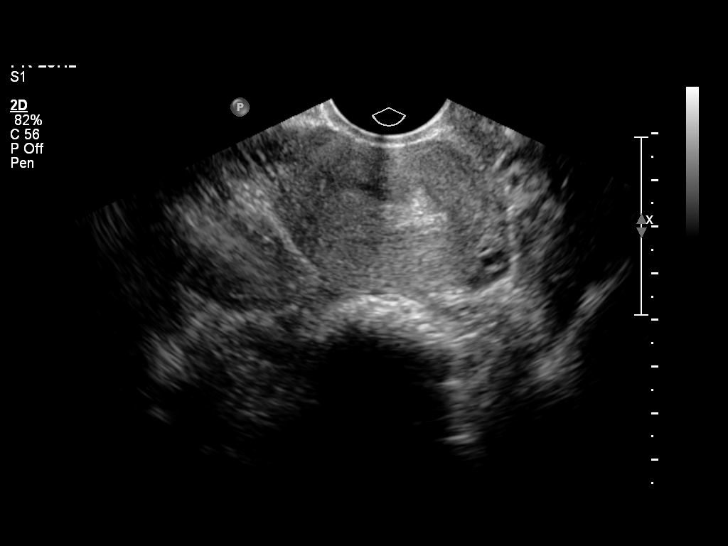
[im 30/43]
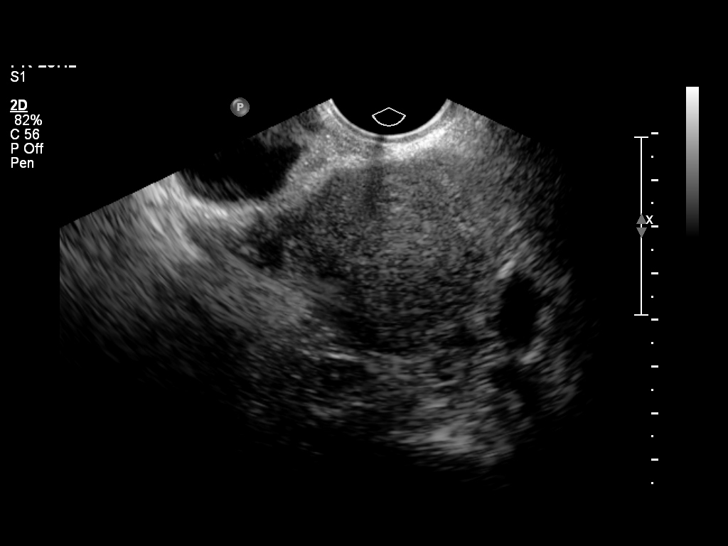
[im 33/43]
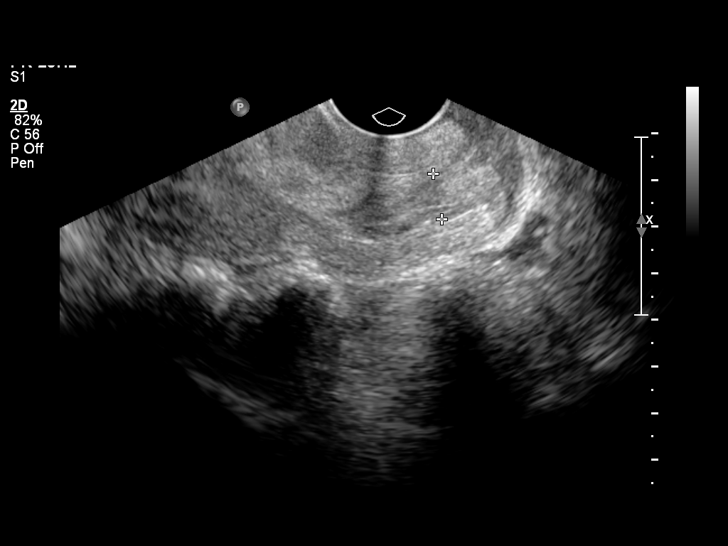
[im 36/43]
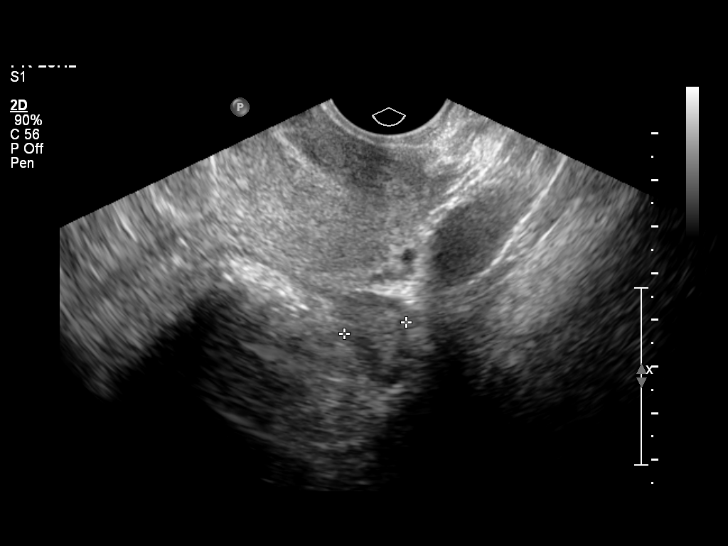
[im 39/43]
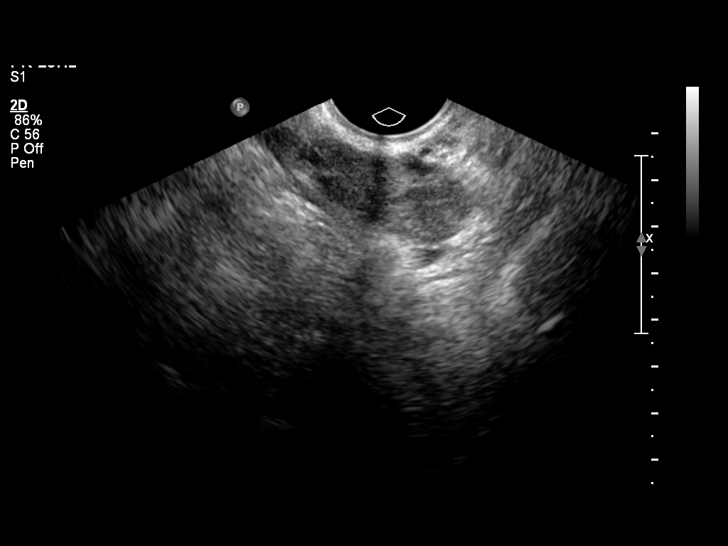
[im 43/43]
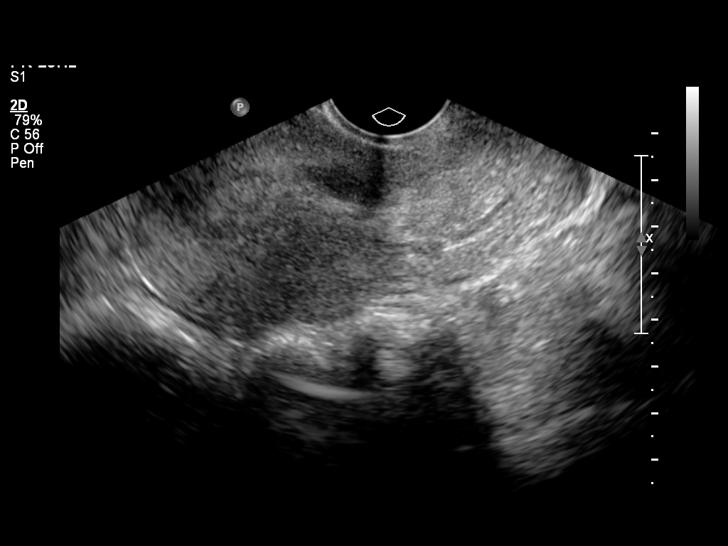

[14 of 28 positions shown; findings below may reference images not displayed]

Intrauterine gestational sac: Not identified

Maternal uterus/Adnexae:
Normal sonographic appearance to the ovaries.  The lower uterine
segment/cervical canal is distended with internal blood
clot/debris.  No free fluid identified within the pelvis.
IMPRESSION: No intrauterine gestation identified.  Blood clot/debris within the
lower uterine segment/cervical canal. Correlate with serial beta
HCG to exclude retained products of conception.

## 2014-07-15 ENCOUNTER — Encounter (HOSPITAL_COMMUNITY): Payer: Self-pay | Admitting: *Deleted

## 2015-11-17 ENCOUNTER — Emergency Department (HOSPITAL_COMMUNITY): Payer: Medicaid Other

## 2015-11-17 ENCOUNTER — Encounter (HOSPITAL_COMMUNITY): Payer: Self-pay | Admitting: *Deleted

## 2015-11-17 ENCOUNTER — Emergency Department (HOSPITAL_COMMUNITY)
Admission: EM | Admit: 2015-11-17 | Discharge: 2015-11-17 | Disposition: A | Discharge status: Home / Self Care | Payer: Medicaid Other | Attending: Emergency Medicine | Admitting: Emergency Medicine

## 2015-11-17 DIAGNOSIS — B373 Candidiasis of vulva and vagina: Secondary | ICD-10-CM | POA: Diagnosis not present

## 2015-11-17 DIAGNOSIS — Z87891 Personal history of nicotine dependence: Secondary | ICD-10-CM | POA: Diagnosis not present

## 2015-11-17 DIAGNOSIS — Z8719 Personal history of other diseases of the digestive system: Secondary | ICD-10-CM | POA: Insufficient documentation

## 2015-11-17 DIAGNOSIS — Z8679 Personal history of other diseases of the circulatory system: Secondary | ICD-10-CM | POA: Diagnosis not present

## 2015-11-17 DIAGNOSIS — Z3491 Encounter for supervision of normal pregnancy, unspecified, first trimester: Secondary | ICD-10-CM

## 2015-11-17 DIAGNOSIS — O98911 Unspecified maternal infectious and parasitic disease complicating pregnancy, first trimester: Secondary | ICD-10-CM | POA: Diagnosis not present

## 2015-11-17 DIAGNOSIS — O26899 Other specified pregnancy related conditions, unspecified trimester: Secondary | ICD-10-CM

## 2015-11-17 DIAGNOSIS — R1032 Left lower quadrant pain: Secondary | ICD-10-CM | POA: Diagnosis not present

## 2015-11-17 DIAGNOSIS — Z3A10 10 weeks gestation of pregnancy: Secondary | ICD-10-CM | POA: Insufficient documentation

## 2015-11-17 DIAGNOSIS — O9989 Other specified diseases and conditions complicating pregnancy, childbirth and the puerperium: Secondary | ICD-10-CM | POA: Diagnosis not present

## 2015-11-17 DIAGNOSIS — R109 Unspecified abdominal pain: Secondary | ICD-10-CM

## 2015-11-17 DIAGNOSIS — B3731 Acute candidiasis of vulva and vagina: Secondary | ICD-10-CM

## 2015-11-17 DIAGNOSIS — R52 Pain, unspecified: Secondary | ICD-10-CM

## 2015-11-17 HISTORY — DX: Migraine, unspecified, not intractable, without status migrainosus: G43.909

## 2015-11-17 HISTORY — DX: Acute pancreatitis without necrosis or infection, unspecified: K85.90

## 2015-11-17 LAB — URINALYSIS, ROUTINE W REFLEX MICROSCOPIC
BILIRUBIN URINE: NEGATIVE
Glucose, UA: NEGATIVE mg/dL
Hgb urine dipstick: NEGATIVE
KETONES UR: NEGATIVE mg/dL
Leukocytes, UA: NEGATIVE
NITRITE: NEGATIVE
PH: 5.5 (ref 5.0–8.0)
Protein, ur: NEGATIVE mg/dL
Specific Gravity, Urine: 1.029 (ref 1.005–1.030)

## 2015-11-17 LAB — CBC WITH DIFFERENTIAL/PLATELET
BASOS ABS: 0 10*3/uL (ref 0.0–0.1)
Basophils Relative: 0 %
EOS PCT: 2 %
Eosinophils Absolute: 0.1 10*3/uL (ref 0.0–0.7)
HEMATOCRIT: 37.9 % (ref 36.0–46.0)
HEMOGLOBIN: 11.8 g/dL — AB (ref 12.0–15.0)
LYMPHS PCT: 20 %
Lymphs Abs: 1.6 10*3/uL (ref 0.7–4.0)
MCH: 26.8 pg (ref 26.0–34.0)
MCHC: 31.1 g/dL (ref 30.0–36.0)
MCV: 85.9 fL (ref 78.0–100.0)
Monocytes Absolute: 0.5 10*3/uL (ref 0.1–1.0)
Monocytes Relative: 6 %
NEUTROS ABS: 6 10*3/uL (ref 1.7–7.7)
NEUTROS PCT: 72 %
PLATELETS: 256 10*3/uL (ref 150–400)
RBC: 4.41 MIL/uL (ref 3.87–5.11)
RDW: 13.9 % (ref 11.5–15.5)
WBC: 8.2 10*3/uL (ref 4.0–10.5)

## 2015-11-17 LAB — WET PREP, GENITAL
Clue Cells Wet Prep HPF POC: NONE SEEN
Sperm: NONE SEEN
TRICH WET PREP: NONE SEEN
Yeast Wet Prep HPF POC: NONE SEEN

## 2015-11-17 LAB — ABO/RH: ABO/RH(D): O POS

## 2015-11-17 LAB — RPR: RPR: NONREACTIVE

## 2015-11-17 LAB — HIV ANTIBODY (ROUTINE TESTING W REFLEX): HIV Screen 4th Generation wRfx: NONREACTIVE

## 2015-11-17 LAB — HCG, QUANTITATIVE, PREGNANCY: HCG, BETA CHAIN, QUANT, S: 200593 m[IU]/mL — AB (ref <5)

## 2015-11-17 MED ORDER — MICONAZOLE NITRATE 200 MG VA SUPP: 200.0000 mg | Freq: Every day | VAGINAL | Status: DC

## 2015-11-17 NOTE — ED Notes (Signed)
Patient is alert and oriented x3.  She was given DC instructions and follow up visit instructions.  Patient gave verbal understanding. She was DC ambulatory under her own power to home.  V/S stable.  He was not showing any signs of distress on DC 

## 2015-11-17 NOTE — ED Notes (Signed)
Bed: [id number] Expected date:  Expected time:  Means of arrival:  Comments: RM 1

## 2015-11-17 NOTE — ED Notes (Signed)
Patient is alert and oriented x4.  She is complaining of abdominal cramping that started last week but has Increased in intensity over the last 24 hrs.  Patient states that she missed her Feb period and has taken  Multiple pregnancy test with positive results.  Currently she rates her cramping 4 of 10

## 2015-11-17 NOTE — ED Provider Notes (Signed)
CSN: [id number]     Arrival date & time 11/17/15  0716 History   First MD Initiated Contact with Patient 11/17/15 0725     Chief Complaint  Patient presents with  . Abdominal Cramping     (Consider location/radiation/quality/duration/timing/severity/associated sxs/prior Treatment) HPI Comments: 29yo F [id number] who p/w abdominal cramping and positive pregnancy test. Pt had multiple positive UPTs 2 weeks ago. No OBGYN visit yet, it is scheduled for next week. A few days ago she began having left lower abdominal cramping. The pain has intensified over the past 24 hours. The pain is sometimes worse when she stands up quickly. She has noticed some white vaginal discharge of the past 2 days which is abnormal for her. No vaginal bleeding. No urinary symptoms. No fevers, cough/cold symptoms, vomiting, or diarrhea.  Patient is a 30 y.o. female presenting with cramps. The history is provided by the patient.  Abdominal Cramping    Past Medical History  Diagnosis Date  . Pancreatitis, acute   . Migraines    Past Surgical History  Procedure Laterality Date  . Cholecystectomy     Family History  Problem Relation Age of Onset  . Hypertension Father   . Cancer Maternal Grandmother     brain, lung  . Cancer Paternal Grandmother     breast   Social History  Substance Use Topics  . Smoking status: Former Smoker    Types: Cigarettes    Quit date: 09/25/2010  . Smokeless tobacco: Never Used  . Alcohol Use: No   OB History    Gravida Para Term Preterm AB TAB SAB Ectopic Multiple Living   2 1  1      1      Review of Systems 10 Systems reviewed and are negative for acute change except as noted in the HPI.    Allergies  Duloxetine; Divalproex sodium; and Ibuprofen  Home Medications   Prior to Admission medications   Medication Sig Start Date End Date Taking? Authorizing Provider  miconazole (MICOTIN) 200 MG vaginal suppository Place 1 suppository (200 mg total) vaginally at bedtime.  11/17/15   Ambrose Finlandachel Morgan Zoey Gilkeson, MD   BP 127/79 mmHg  Pulse 90  Temp(Src) 98.1 F (36.7 C) (Oral)  Resp 18  SpO2 100% Physical Exam  Constitutional: She is oriented to person, place, and time. She appears well-developed and well-nourished. No distress.  HENT:  Head: Normocephalic and atraumatic.  Moist mucous membranes  Eyes: Conjunctivae are normal. Pupils are equal, round, and reactive to light.  Neck: Neck supple.  Cardiovascular: Normal rate, regular rhythm and normal heart sounds.   No murmur heard. Pulmonary/Chest: Effort normal and breath sounds normal.  Abdominal: Soft. Bowel sounds are normal. She exhibits no distension. There is no tenderness.  Genitourinary: Vagina normal.  Small amount of white, cottage-cheese like discharge in vaginal vault, no cervical motion/adnexal tenderness  Musculoskeletal: She exhibits no edema.  Neurological: She is alert and oriented to person, place, and time.  Fluent speech  Skin: Skin is warm and dry.  Psychiatric: She has a normal mood and affect. Judgment normal.  Nursing note and vitals reviewed.  Chaperone was present during exam.  ED Course  Procedures (including critical care time) Labs Review Labs Reviewed  CBC WITH DIFFERENTIAL/PLATELET - Abnormal; Notable for the following:    Hemoglobin 11.8 (*)    All other components within normal limits  HCG, QUANTITATIVE, PREGNANCY - Abnormal; Notable for the following:    hCG, Beta Francene FindersChain, Quant, S [id number] (*)  All other components within normal limits  WET PREP, GENITAL  URINALYSIS, ROUTINE W REFLEX MICROSCOPIC (NOT AT [facility])  RPR  HIV ANTIBODY (ROUTINE TESTING)  ABO/RH  GC/CHLAMYDIA PROBE AMP ([place]) NOT AT [facility]    Imaging Review US Ob Comp Less 14 Wks  11/17/2015  CLINICAL DATA:  Pelvic cramping for 1 week EXAM: OBSTETRIC <14 WK [place] AND TRANSVAGINAL OB US TECHNIQUE: Both transabdominal and transvaginal ultrasound examinations were performed for complete evaluation of the  gestation as well as the maternal uterus, adnexal regions, and pelvic cul-de-sac. Transvaginal technique was performed to assess early pregnancy. COMPARISON:  None. FINDINGS: Intrauterine gestational sac: Visualized/normal in shape. Yolk sac:  Not visualized Embryo:  Visualized Cardiac Activity: Visualized Heart Rate: 171  bpm MSD:   mm    w     d CRL:  35  mm   10 w   3 d                  [place] EDC: 06/11/2016 Subchorionic hemorrhage:  None visualized. Maternal uterus/adnexae: Right corpus luteum cyst. No adnexal masses. No free fluid. IMPRESSION: Ten week 3 day intrauterine pregnancy. Fetal heart rate 171 beats per minute. No acute maternal findings. Electronically Signed   By: Charlett Nose M.D.   On: 11/17/2015 09:44   US Ob Transvaginal  11/17/2015  CLINICAL DATA:  Pelvic cramping for 1 week EXAM: OBSTETRIC <14 WK [place] AND TRANSVAGINAL OB US TECHNIQUE: Both transabdominal and transvaginal ultrasound examinations were performed for complete evaluation of the gestation as well as the maternal uterus, adnexal regions, and pelvic cul-de-sac. Transvaginal technique was performed to assess early pregnancy. COMPARISON:  None. FINDINGS: Intrauterine gestational sac: Visualized/normal in shape. Yolk sac:  Not visualized Embryo:  Visualized Cardiac Activity: Visualized Heart Rate: 171  bpm MSD:   mm    w     d CRL:  35  mm   10 w   3 d                  [place] EDC: 06/11/2016 Subchorionic hemorrhage:  None visualized. Maternal uterus/adnexae: Right corpus luteum cyst. No adnexal masses. No free fluid. IMPRESSION: Ten week 3 day intrauterine pregnancy. Fetal heart rate 171 beats per minute. No acute maternal findings. Electronically Signed   By: Charlett Nose M.D.   On: 11/17/2015 09:44   I have personally reviewed and evaluated these lab results as part of my medical decision-making.   EKG Interpretation None      MDM   Final diagnoses:  Intrauterine normal pregnancy, first trimester  Vaginal yeast infection   Abdominal cramping affecting pregnancy   Patient with recent positive home urine pregnancy test presents with several days of lower abdominal cramping as well as some white vaginal discharge without any reports of vaginal bleeding. On exam, she was well-appearing with normal vital signs. No abdominal tenderness on exam. White vaginal discharge noted consistent with vaginal yeast infection. Obtained above lab work including beta hCG, Rh, as well as pelvic ultrasound to rule out ectopic pregnancy or ovarian pathology.  Labs show Rh+, beta hCG 200,000, ultrasound with normal intrauterine pregnancy at 10 weeks and 3 days fetal heart rate 171. Given patient's well appearance and I feel she is safe for discharge with OB/GYN follow-up. I have discussed prenatal care including starting prenatal vitamins. Gave monistat for yeast infection. Return precautions reviewed including vaginal bleeding, severe worsening of pain, fever, or any other new symptoms. Patient voiced understanding and was discharged  in satisfactory condition.  Laurence Spates, MD 11/17/15 1028

## 2015-11-18 LAB — GC/CHLAMYDIA PROBE AMP (~~LOC~~) NOT AT ARMC
CHLAMYDIA, DNA PROBE: NEGATIVE
Neisseria Gonorrhea: NEGATIVE

## 2015-12-03 ENCOUNTER — Encounter (HOSPITAL_COMMUNITY): Payer: Self-pay | Admitting: Family Medicine

## 2015-12-03 ENCOUNTER — Emergency Department (HOSPITAL_COMMUNITY)
Admission: EM | Admit: 2015-12-03 | Discharge: 2015-12-03 | Disposition: A | Discharge status: Home / Self Care | Payer: Medicaid Other | Attending: Emergency Medicine | Admitting: Emergency Medicine

## 2015-12-03 DIAGNOSIS — Z87891 Personal history of nicotine dependence: Secondary | ICD-10-CM | POA: Insufficient documentation

## 2015-12-03 DIAGNOSIS — Z3A13 13 weeks gestation of pregnancy: Secondary | ICD-10-CM | POA: Diagnosis not present

## 2015-12-03 DIAGNOSIS — R42 Dizziness and giddiness: Secondary | ICD-10-CM | POA: Insufficient documentation

## 2015-12-03 DIAGNOSIS — Z8679 Personal history of other diseases of the circulatory system: Secondary | ICD-10-CM | POA: Diagnosis not present

## 2015-12-03 DIAGNOSIS — O210 Mild hyperemesis gravidarum: Secondary | ICD-10-CM | POA: Diagnosis present

## 2015-12-03 DIAGNOSIS — O9989 Other specified diseases and conditions complicating pregnancy, childbirth and the puerperium: Secondary | ICD-10-CM | POA: Diagnosis not present

## 2015-12-03 LAB — URINALYSIS, ROUTINE W REFLEX MICROSCOPIC
Glucose, UA: NEGATIVE mg/dL
Hgb urine dipstick: NEGATIVE
Ketones, ur: 15 mg/dL — AB
NITRITE: NEGATIVE
Protein, ur: 30 mg/dL — AB
SPECIFIC GRAVITY, URINE: 1.037 — AB (ref 1.005–1.030)
pH: 5.5 (ref 5.0–8.0)

## 2015-12-03 LAB — COMPREHENSIVE METABOLIC PANEL
ALK PHOS: 138 U/L — AB (ref 38–126)
ALT: 43 U/L (ref 14–54)
ANION GAP: 15 (ref 5–15)
AST: 31 U/L (ref 15–41)
Albumin: 3.5 g/dL (ref 3.5–5.0)
BILIRUBIN TOTAL: 0.3 mg/dL (ref 0.3–1.2)
BUN: 7 mg/dL (ref 6–20)
CALCIUM: 9.6 mg/dL (ref 8.9–10.3)
CO2: 21 mmol/L — ABNORMAL LOW (ref 22–32)
Chloride: 100 mmol/L — ABNORMAL LOW (ref 101–111)
Creatinine, Ser: 0.68 mg/dL (ref 0.44–1.00)
GFR calc non Af Amer: >60 mL/min (ref >60)
Glucose, Bld: 83 mg/dL (ref 65–99)
POTASSIUM: 3.5 mmol/L (ref 3.5–5.1)
Sodium: 136 mmol/L (ref 135–145)
TOTAL PROTEIN: 7.3 g/dL (ref 6.5–8.1)

## 2015-12-03 LAB — CBC
HCT: 38.6 % (ref 36.0–46.0)
HEMOGLOBIN: 12.8 g/dL (ref 12.0–15.0)
MCH: 27.5 pg (ref 26.0–34.0)
MCHC: 33.2 g/dL (ref 30.0–36.0)
MCV: 83 fL (ref 78.0–100.0)
Platelets: 256 10*3/uL (ref 150–400)
RBC: 4.65 MIL/uL (ref 3.87–5.11)
RDW: 13.7 % (ref 11.5–15.5)
WBC: 9.3 10*3/uL (ref 4.0–10.5)

## 2015-12-03 LAB — URINE MICROSCOPIC-ADD ON

## 2015-12-03 LAB — LIPASE, BLOOD: Lipase: 24 U/L (ref 11–51)

## 2015-12-03 MED ORDER — SODIUM CHLORIDE 0.9 % IV BOLUS (SEPSIS): 1000.0000 mL | Freq: Once | INTRAVENOUS | Status: DC

## 2015-12-03 MED ORDER — ONDANSETRON 4 MG PO TBDP
4.0000 mg | ORAL_TABLET | Freq: Once | ORAL | Status: AC
Start: 2015-12-03 — End: 2015-12-03
  Administered 2015-12-03: 4 mg via ORAL
  Filled 2015-12-03: qty 1

## 2015-12-03 MED ORDER — ONDANSETRON HCL 4 MG/2ML IJ SOLN: 4.0000 mg | Freq: Once | INTRAMUSCULAR | Status: DC

## 2015-12-03 NOTE — ED Provider Notes (Signed)
CSN: [id number]     Arrival date & time 12/03/15  4098 History   First MD Initiated Contact with Patient 12/03/15 1118     Chief Complaint  Patient presents with  . Emesis During Pregnancy  . Dizziness     (Consider location/radiation/quality/duration/timing/severity/associated sxs/prior Treatment) HPI Comments: Patient presents with nausea and vomiting. She has 12-[redacted] weeks pregnant. She had a recent pelvic ultrasound on March 6 for abdominal cramping which showed a 10 week intrauterine pregnancy. She reports that she's had worsening nausea and vomiting over the last week. She denies any abdominal pain or cramping. No vaginal bleeding or discharge. She states she's not really able to keep any food down because of the vomiting. She is not yet seen an OB/GYN. She has an appointment with Dr. Gaynell Face today at 3:30.  Patient is a 30 y.o. female presenting with dizziness.  Dizziness Associated symptoms: nausea and vomiting   Associated symptoms: no blood in stool, no chest pain, no diarrhea, no headaches, no shortness of breath and no weakness     Past Medical History  Diagnosis Date  . Pancreatitis, acute   . Migraines    Past Surgical History  Procedure Laterality Date  . Cholecystectomy     Family History  Problem Relation Age of Onset  . Hypertension Father   . Cancer Maternal Grandmother     brain, lung  . Cancer Paternal Grandmother     breast   Social History  Substance Use Topics  . Smoking status: Former Smoker    Types: Cigarettes    Quit date: 09/25/2010  . Smokeless tobacco: Never Used  . Alcohol Use: No   OB History    Gravida Para Term Preterm AB TAB SAB Ectopic Multiple Living   Review of Systems  Constitutional: Negative for fever, chills, diaphoresis and fatigue.  HENT: Negative for congestion, rhinorrhea and sneezing.   Eyes: Negative.   Respiratory: Negative for cough, chest tightness and shortness of breath.   Cardiovascular:  Negative for chest pain and leg swelling.  Gastrointestinal: Positive for nausea and vomiting. Negative for abdominal pain, diarrhea and blood in stool.  Genitourinary: Negative for frequency, hematuria, flank pain and difficulty urinating.  Musculoskeletal: Negative for back pain and arthralgias.  Skin: Negative for rash.  Neurological: Positive for dizziness and light-headedness. Negative for speech difficulty, weakness, numbness and headaches.      Allergies  Duloxetine; Divalproex sodium; and Ibuprofen  Home Medications   Prior to Admission medications   Not on File   BP 115/78 mmHg  Pulse 92  Temp(Src) 98.4 F (36.9 C) (Oral)  Resp 18  Ht  (1.702 m)  Wt 220 lb (99.791 kg)  BMI 34.45 kg/m2  SpO2 100% Physical Exam  Constitutional: She is oriented to person, place, and time. She appears well-developed and well-nourished.  HENT:  Head: Normocephalic and atraumatic.  Eyes: Pupils are equal, round, and reactive to light.  Neck: Normal range of motion. Neck supple.  Cardiovascular: Normal rate, regular rhythm and normal heart sounds.   Pulmonary/Chest: Effort normal and breath sounds normal. No respiratory distress. She has no wheezes. She has no rales. She exhibits no tenderness.  Abdominal: Soft. Bowel sounds are normal. There is no tenderness. There is no rebound and no guarding.  Musculoskeletal: Normal range of motion. She exhibits no edema.  Lymphadenopathy:    She has no cervical adenopathy.  Neurological: She is alert  and oriented to person, place, and time.  Skin: Skin is warm and dry. No rash noted.  Psychiatric: She has a normal mood and affect.    ED Course  Procedures (including critical care time) Labs Review Labs Reviewed  COMPREHENSIVE METABOLIC PANEL - Abnormal; Notable for the following:    Chloride 100 (*)    CO2 21 (*)    Alkaline Phosphatase 138 (*)    All other components within normal limits  URINALYSIS, ROUTINE W REFLEX MICROSCOPIC (NOT  [facility]) - Abnormal; Notable for the following:    Color, Urine AMBER (*)    APPearance CLOUDY (*)    Specific Gravity, Urine 1.037 (*)    Bilirubin Urine SMALL (*)    Ketones, ur 15 (*)    Protein, ur 30 (*)    Leukocytes, UA TRACE (*)    All other components within normal limits  URINE MICROSCOPIC-ADD ON - Abnormal; Notable for the following:    Squamous Epithelial / LPF 6-30 (*)    Bacteria, UA MANY (*)    All other components within normal limits  LIPASE, BLOOD  CBC    Imaging Review No results found. I have personally reviewed and evaluated these images and lab results as part of my medical decision-making.   EKG Interpretation None      MDM   Final diagnoses:  Hyperemesis gravidarum   Patient presents with nausea and vomiting in pregnancy. Her labs are unremarkable. Her urine had bacteria but it appears to be a dirty specimen and she has no symptoms consistent with UTI. Her blood pressure is stable. She's not Tachycardic. I plan to give her IV fluids and Zofran. However she states that she needs to leave because she has to pick up her child at school and then make her OB/GYN appointment. She does not want to stay for IV fluids. She was discharged home in good condition. She was strongly encouraged to keep her OB/GYN appointment today. Return precautions were given.   Rolan BuccoMelanie Pierre Dellarocco, MD 12/03/15 [phone]

## 2015-12-03 NOTE — Discharge Instructions (Signed)
Hyperemesis Gravidarum  Hyperemesis gravidarum is a severe form of nausea and vomiting that happens during pregnancy. Hyperemesis is worse than morning sickness. It may cause you to have nausea or vomiting all day for many days. It may keep you from eating and drinking enough food and liquids. Hyperemesis usually occurs during the first half (the first 20 weeks) of pregnancy. It often goes away once a woman is in her second half of pregnancy. However, sometimes hyperemesis continues through an entire pregnancy.   CAUSES   The cause of this condition is not completely known but is thought to be related to changes in the body's hormones when pregnant. It could be from the high level of the pregnancy hormone or an increase in estrogen in the body.   SIGNS AND SYMPTOMS    Severe nausea and vomiting.   Nausea that does not go away.   Vomiting that does not allow you to keep any food down.   Weight loss and body fluid loss (dehydration).   Having no desire to eat or not liking food you have previously enjoyed.  DIAGNOSIS   Your health care provider will do a physical exam and ask you about your symptoms. He or she may also order blood tests and urine tests to make sure something else is not causing the problem.   TREATMENT   You may only need medicine to control the problem. If medicines do not control the nausea and vomiting, you will be treated in the hospital to prevent dehydration, increased acid in the blood (acidosis), weight loss, and changes in the electrolytes in your body that may harm the unborn baby (fetus). You may need IV fluids.   HOME CARE INSTRUCTIONS    Only take over-the-counter or prescription medicines as directed by your health care provider.   Try eating a couple of dry crackers or toast in the morning before getting out of bed.   Avoid foods and smells that upset your stomach.   Avoid fatty and spicy foods.   Eat 5-6 small meals a day.   Do not drink when eating meals. Drink between  meals.   For snacks, eat high-protein foods, such as cheese.   Eat or suck on things that have ginger in them. Ginger helps nausea.   Avoid food preparation. The smell of food can spoil your appetite.   Avoid iron pills and iron in your multivitamins until after 3-4 months of being pregnant. However, consult with your health care provider before stopping any prescribed iron pills.  SEEK MEDICAL CARE IF:    Your abdominal pain increases.   You have a severe headache.   You have vision problems.   You are losing weight.  SEEK IMMEDIATE MEDICAL CARE IF:    You are unable to keep fluids down.   You vomit blood.   You have constant nausea and vomiting.   You have excessive weakness.   You have extreme thirst.   You have dizziness or fainting.   You have a fever or persistent symptoms for more than 2-3 days.   You have a fever and your symptoms suddenly get worse.  MAKE SURE YOU:    Understand these instructions.   Will watch your condition.   Will get help right away if you are not doing well or get worse.     This information is not intended to replace advice given to you by your health care provider. Make sure you discuss any questions you have with   your health care provider.     Document Released: 08/30/2005 Document Revised: 06/20/2013 Document Reviewed: 04/11/2013  Elsevier Interactive Patient Education 2016 [employer]

## 2015-12-03 NOTE — ED Notes (Signed)
Pt here for light headedness, weakness, dizziness. sts hasn't eaten in 3 days. sts vomiting.

## 2015-12-03 NOTE — ED Notes (Signed)
Declined W/C at D/C and was escorted to lobby by RN. 

## 2015-12-03 NOTE — ED Notes (Signed)
Pt reports to be 12 weeks pregant

## 2015-12-08 ENCOUNTER — Emergency Department (HOSPITAL_COMMUNITY)
Admission: EM | Admit: 2015-12-08 | Discharge: 2015-12-08 | Disposition: A | Discharge status: Home / Self Care | Payer: Medicaid Other | Attending: Emergency Medicine | Admitting: Emergency Medicine

## 2015-12-08 ENCOUNTER — Encounter (HOSPITAL_COMMUNITY): Payer: Self-pay | Admitting: Emergency Medicine

## 2015-12-08 DIAGNOSIS — Z87891 Personal history of nicotine dependence: Secondary | ICD-10-CM | POA: Diagnosis not present

## 2015-12-08 DIAGNOSIS — O210 Mild hyperemesis gravidarum: Secondary | ICD-10-CM | POA: Diagnosis not present

## 2015-12-08 DIAGNOSIS — Z3A12 12 weeks gestation of pregnancy: Secondary | ICD-10-CM | POA: Insufficient documentation

## 2015-12-08 DIAGNOSIS — Z8719 Personal history of other diseases of the digestive system: Secondary | ICD-10-CM | POA: Diagnosis not present

## 2015-12-08 DIAGNOSIS — R112 Nausea with vomiting, unspecified: Secondary | ICD-10-CM

## 2015-12-08 DIAGNOSIS — Z8679 Personal history of other diseases of the circulatory system: Secondary | ICD-10-CM | POA: Insufficient documentation

## 2015-12-08 LAB — URINALYSIS, ROUTINE W REFLEX MICROSCOPIC
Bilirubin Urine: NEGATIVE
GLUCOSE, UA: NEGATIVE mg/dL
Hgb urine dipstick: NEGATIVE
Ketones, ur: NEGATIVE mg/dL
Nitrite: NEGATIVE
PROTEIN: NEGATIVE mg/dL
SPECIFIC GRAVITY, URINE: 1.028 (ref 1.005–1.030)
pH: 7 (ref 5.0–8.0)

## 2015-12-08 LAB — CBC WITH DIFFERENTIAL/PLATELET
BASOS ABS: 0 10*3/uL (ref 0.0–0.1)
BASOS PCT: 0 %
EOS ABS: 0.1 10*3/uL (ref 0.0–0.7)
EOS PCT: 1 %
HCT: 37.9 % (ref 36.0–46.0)
Hemoglobin: 12.2 g/dL (ref 12.0–15.0)
Lymphocytes Relative: 20 %
Lymphs Abs: 1.8 10*3/uL (ref 0.7–4.0)
MCH: 26.9 pg (ref 26.0–34.0)
MCHC: 32.2 g/dL (ref 30.0–36.0)
MCV: 83.5 fL (ref 78.0–100.0)
MONO ABS: 0.4 10*3/uL (ref 0.1–1.0)
MONOS PCT: 4 %
Neutro Abs: 6.7 10*3/uL (ref 1.7–7.7)
Neutrophils Relative %: 75 %
PLATELETS: 244 10*3/uL (ref 150–400)
RBC: 4.54 MIL/uL (ref 3.87–5.11)
RDW: 13.9 % (ref 11.5–15.5)
WBC: 9 10*3/uL (ref 4.0–10.5)

## 2015-12-08 LAB — COMPREHENSIVE METABOLIC PANEL
ALBUMIN: 3.7 g/dL (ref 3.5–5.0)
ALT: 23 U/L (ref 14–54)
ANION GAP: 9 (ref 5–15)
AST: 18 U/L (ref 15–41)
Alkaline Phosphatase: 126 U/L (ref 38–126)
BILIRUBIN TOTAL: 0.3 mg/dL (ref 0.3–1.2)
BUN: 8 mg/dL (ref 6–20)
CHLORIDE: 105 mmol/L (ref 101–111)
CO2: 24 mmol/L (ref 22–32)
Calcium: 9.1 mg/dL (ref 8.9–10.3)
Creatinine, Ser: 0.62 mg/dL (ref 0.44–1.00)
GFR calc Af Amer: >60 mL/min (ref >60)
GFR calc non Af Amer: >60 mL/min (ref >60)
GLUCOSE: 80 mg/dL (ref 65–99)
POTASSIUM: 3.5 mmol/L (ref 3.5–5.1)
SODIUM: 138 mmol/L (ref 135–145)
TOTAL PROTEIN: 7.4 g/dL (ref 6.5–8.1)

## 2015-12-08 LAB — URINE MICROSCOPIC-ADD ON

## 2015-12-08 MED ORDER — ONDANSETRON 4 MG PO TBDP: 4.0000 mg | ORAL_TABLET | Freq: Three times a day (TID) | ORAL | Status: AC | PRN

## 2015-12-08 MED ORDER — DOXYLAMINE-PYRIDOXINE 10-10 MG PO TBEC: 1.0000 | DELAYED_RELEASE_TABLET | ORAL | Status: DC

## 2015-12-08 MED ORDER — PYRIDOXINE HCL 100 MG/ML IJ SOLN
100.0000 mg | Freq: Once | INTRAMUSCULAR | Status: AC
  Administered 2015-12-08: 100 mg via INTRAVENOUS
  Filled 2015-12-08: qty 1

## 2015-12-08 MED ORDER — SODIUM CHLORIDE 0.9 % IV BOLUS (SEPSIS)
1000.0000 mL | Freq: Once | INTRAVENOUS | Status: AC
  Administered 2015-12-08: 1000 mL via INTRAVENOUS

## 2015-12-08 NOTE — Discharge Instructions (Signed)
Hyperemesis Gravidarum  Hyperemesis gravidarum is a severe form of nausea and vomiting that happens during pregnancy. Hyperemesis is worse than morning sickness. It may cause you to have nausea or vomiting all day for many days. It may keep you from eating and drinking enough food and liquids. Hyperemesis usually occurs during the first half (the first 20 weeks) of pregnancy. It often goes away once a woman is in her second half of pregnancy. However, sometimes hyperemesis continues through an entire pregnancy.   CAUSES   The cause of this condition is not completely known but is thought to be related to changes in the body's hormones when pregnant. It could be from the high level of the pregnancy hormone or an increase in estrogen in the body.   SIGNS AND SYMPTOMS    Severe nausea and vomiting.   Nausea that does not go away.   Vomiting that does not allow you to keep any food down.   Weight loss and body fluid loss (dehydration).   Having no desire to eat or not liking food you have previously enjoyed.  DIAGNOSIS   Your health care provider will do a physical exam and ask you about your symptoms. He or she may also order blood tests and urine tests to make sure something else is not causing the problem.   TREATMENT   You may only need medicine to control the problem. If medicines do not control the nausea and vomiting, you will be treated in the hospital to prevent dehydration, increased acid in the blood (acidosis), weight loss, and changes in the electrolytes in your body that may harm the unborn baby (fetus). You may need IV fluids.   HOME CARE INSTRUCTIONS    Only take over-the-counter or prescription medicines as directed by your health care provider.   Try eating a couple of dry crackers or toast in the morning before getting out of bed.   Avoid foods and smells that upset your stomach.   Avoid fatty and spicy foods.   Eat 5-6 small meals a day.   Do not drink when eating meals. Drink between  meals.   For snacks, eat high-protein foods, such as cheese.   Eat or suck on things that have ginger in them. Ginger helps nausea.   Avoid food preparation. The smell of food can spoil your appetite.   Avoid iron pills and iron in your multivitamins until after 3-4 months of being pregnant. However, consult with your health care provider before stopping any prescribed iron pills.  SEEK MEDICAL CARE IF:    Your abdominal pain increases.   You have a severe headache.   You have vision problems.   You are losing weight.  SEEK IMMEDIATE MEDICAL CARE IF:    You are unable to keep fluids down.   You vomit blood.   You have constant nausea and vomiting.   You have excessive weakness.   You have extreme thirst.   You have dizziness or fainting.   You have a fever or persistent symptoms for more than 2-3 days.   You have a fever and your symptoms suddenly get worse.  MAKE SURE YOU:    Understand these instructions.   Will watch your condition.   Will get help right away if you are not doing well or get worse.     This information is not intended to replace advice given to you by your health care provider. Make sure you discuss any questions you have with   your health care provider.     Document Released: 08/30/2005 Document Revised: 06/20/2013 Document Reviewed: 04/11/2013  Elsevier Interactive Patient Education 2016 [employer]

## 2015-12-08 NOTE — ED Notes (Signed)
RN IN ROOM STARTING IV

## 2015-12-08 NOTE — ED Notes (Signed)
Two unsuccessful IV attempt. 

## 2015-12-08 NOTE — ED Notes (Signed)
Pt given ice water to encourage PO challenge.

## 2015-12-08 NOTE — ED Notes (Signed)
Pt c/o nausea, emesis, dizziness, anorexia. Pt 12 weeks present. Hasn't been able to get prescription for nausea. No pain.

## 2015-12-08 NOTE — ED Provider Notes (Signed)
CSN: [id number]     Arrival date & time 12/08/15  4098 History   First MD Initiated Contact with Patient 12/08/15 [phone]     Chief Complaint  Patient presents with  . Nausea  . Emesis     (Consider location/radiation/quality/duration/timing/severity/associated sxs/prior Treatment) HPI Comments: Tried emetrol but made it worse 2 week of n/v intensified 4 days ago, went to ED, improved then came back Vomiting over 20 times per day Smells exacerbate it, if get up too fast First pregnancy had hyperemesis, zofran only medication that helped in the past Feeling fatigued  [address] Dr. Clayborn Heron 4/2 1st appt    Patient is a 30 y.o. female presenting with vomiting.  Emesis Associated symptoms: no abdominal pain, no diarrhea, no headaches and no sore throat     Past Medical History  Diagnosis Date  . Pancreatitis, acute   . Migraines    Past Surgical History  Procedure Laterality Date  . Cholecystectomy     Family History  Problem Relation Age of Onset  . Hypertension Father   . Cancer Maternal Grandmother     brain, lung  . Cancer Paternal Grandmother     breast   Social History  Substance Use Topics  . Smoking status: Former Smoker    Types: Cigarettes    Quit date: 09/25/2010  . Smokeless tobacco: Never Used  . Alcohol Use: No   OB History    Gravida Para Term Preterm AB TAB SAB Ectopic Multiple Living   Review of Systems  Constitutional: Negative for fever.  HENT: Negative for sore throat.   Eyes: Negative for visual disturbance.  Respiratory: Negative for cough and shortness of breath.   Cardiovascular: Negative for chest pain.  Gastrointestinal: Positive for nausea and vomiting. Negative for abdominal pain, diarrhea and constipation.  Genitourinary: Negative for dysuria, vaginal bleeding, vaginal discharge and difficulty urinating.  Musculoskeletal: Negative for back pain and neck pain.  Skin: Negative for rash.  Neurological:  Negative for syncope and headaches.      Allergies  Duloxetine; Divalproex sodium; and Ibuprofen  Home Medications   Prior to Admission medications   Medication Sig Start Date End Date Taking? Authorizing Provider  Fructose-Dextrose-Phosphor Acd (EMETROL PO) Take 1 tablet by mouth daily as needed (nausea).   Yes Historical Provider, MD  Doxylamine-Pyridoxine 10-10 MG TBEC Take 1 tablet by mouth See admin instructions. Take 2 tablets orally at bedtime on day 1 and day 2, if symptoms persist, take 1 tablet in the morning and 2 tablets at bedtime on day 3, if symptoms persist, may increase to MAX 4 tablets per day, administered as 1 tablet in the morning, 1 tablet in mid-afternoon, and 2 tablets at bedtime 12/08/15   Alvira Monday, MD  ondansetron (ZOFRAN ODT) 4 MG disintegrating tablet Take 1 tablet (4 mg total) by mouth every 8 (eight) hours as needed for refractory nausea / vomiting. 12/08/15   Alvira Monday, MD   BP 108/88 mmHg  Pulse 92  Temp(Src) 97.5 F (36.4 C) (Oral)  Resp 18  SpO2 100% Physical Exam  Constitutional: She is oriented to person, place, and time. She appears well-developed and well-nourished. No distress.  HENT:  Head: Normocephalic and atraumatic.  Eyes: Conjunctivae and EOM are normal.  Neck: Normal range of motion.  Cardiovascular: Normal rate, regular rhythm, normal heart sounds and intact distal pulses.  Exam reveals no gallop and no friction rub.  No murmur heard. Pulmonary/Chest: Effort normal and breath sounds normal. No respiratory distress. She has no wheezes. She has no rales.  Abdominal: Soft. She exhibits no distension. There is no tenderness. There is no guarding.  Musculoskeletal: She exhibits no edema or tenderness.  Neurological: She is alert and oriented to person, place, and time.  Skin: Skin is warm and dry. No rash noted. She is not diaphoretic. No erythema.  Nursing note and vitals reviewed.   ED Course  Procedures (including  critical care time) Labs Review Labs Reviewed  URINALYSIS, ROUTINE W REFLEX MICROSCOPIC (NOT AT [facility]) - Abnormal; Notable for the following:    Color, Urine AMBER (*)    APPearance CLOUDY (*)    Leukocytes, UA SMALL (*)    All other components within normal limits  URINE MICROSCOPIC-ADD ON - Abnormal; Notable for the following:    Squamous Epithelial / LPF 0-5 (*)    Bacteria, UA RARE (*)    All other components within normal limits  URINE CULTURE  CBC WITH DIFFERENTIAL/PLATELET  COMPREHENSIVE METABOLIC PANEL    Imaging Review No results found. I have personally reviewed and evaluated these images and lab results as part of my medical decision-making.   EKG Interpretation None      MDM   Final diagnoses:  Non-intractable vomiting with nausea, vomiting of unspecified type  Hyperemesis arising during pregnancy   30 year old female at 7112 weeks pregnancy presents with concern for nausea and vomiting. Patient has benign abdominal exam, doubt acute intra-abdominal pathology. She has no urinary symptoms, normal urinalysis doubt pyelonephritis. She has a history of hyperemesis with prior pregnancy. Labs showed no significant  abnormalities. Patient was given 1 L of normal saline and Vitamin B6 with improvement of her symptoms.  Pt able to tolerate po fluids in ED. She is given a prescription for diclegis and if she does not get relief with this, will fill rx for second line zofran. Patient discharged in stable condition with understanding of reasons to return.    Alvira MondayErin Nayzeth Altman, MD 12/08/15 2106

## 2015-12-09 LAB — URINE CULTURE

## 2015-12-15 ENCOUNTER — Inpatient Hospital Stay (HOSPITAL_COMMUNITY)
Admission: AD | Admit: 2015-12-15 | Discharge: 2015-12-15 | Disposition: A | Discharge status: Home / Self Care | Payer: Medicaid Other | Source: Ambulatory Visit | Attending: Obstetrics | Admitting: Obstetrics

## 2015-12-15 ENCOUNTER — Other Ambulatory Visit (HOSPITAL_COMMUNITY): Payer: Self-pay | Admitting: Obstetrics

## 2015-12-15 ENCOUNTER — Encounter (HOSPITAL_COMMUNITY): Payer: Self-pay | Admitting: *Deleted

## 2015-12-15 DIAGNOSIS — Z886 Allergy status to analgesic agent status: Secondary | ICD-10-CM | POA: Insufficient documentation

## 2015-12-15 DIAGNOSIS — Z808 Family history of malignant neoplasm of other organs or systems: Secondary | ICD-10-CM | POA: Insufficient documentation

## 2015-12-15 DIAGNOSIS — Z888 Allergy status to other drugs, medicaments and biological substances status: Secondary | ICD-10-CM | POA: Insufficient documentation

## 2015-12-15 DIAGNOSIS — Z803 Family history of malignant neoplasm of breast: Secondary | ICD-10-CM | POA: Insufficient documentation

## 2015-12-15 DIAGNOSIS — G43809 Other migraine, not intractable, without status migrainosus: Secondary | ICD-10-CM | POA: Insufficient documentation

## 2015-12-15 DIAGNOSIS — Z801 Family history of malignant neoplasm of trachea, bronchus and lung: Secondary | ICD-10-CM | POA: Diagnosis not present

## 2015-12-15 DIAGNOSIS — O289 Unspecified abnormal findings on antenatal screening of mother: Secondary | ICD-10-CM

## 2015-12-15 DIAGNOSIS — Z3689 Encounter for other specified antenatal screening: Secondary | ICD-10-CM

## 2015-12-15 DIAGNOSIS — Z9049 Acquired absence of other specified parts of digestive tract: Secondary | ICD-10-CM | POA: Insufficient documentation

## 2015-12-15 DIAGNOSIS — Z3492 Encounter for supervision of normal pregnancy, unspecified, second trimester: Secondary | ICD-10-CM

## 2015-12-15 DIAGNOSIS — Z3A14 14 weeks gestation of pregnancy: Secondary | ICD-10-CM | POA: Diagnosis not present

## 2015-12-15 DIAGNOSIS — Z87891 Personal history of nicotine dependence: Secondary | ICD-10-CM | POA: Insufficient documentation

## 2015-12-15 DIAGNOSIS — Z3491 Encounter for supervision of normal pregnancy, unspecified, first trimester: Secondary | ICD-10-CM

## 2015-12-15 DIAGNOSIS — O26892 Other specified pregnancy related conditions, second trimester: Secondary | ICD-10-CM | POA: Insufficient documentation

## 2015-12-15 LAB — URINALYSIS, ROUTINE W REFLEX MICROSCOPIC
Bilirubin Urine: NEGATIVE
Glucose, UA: NEGATIVE mg/dL
Ketones, ur: NEGATIVE mg/dL
LEUKOCYTES UA: NEGATIVE
NITRITE: NEGATIVE
PROTEIN: NEGATIVE mg/dL
SPECIFIC GRAVITY, URINE: 1.015 (ref 1.005–1.030)
pH: 5.5 (ref 5.0–8.0)

## 2015-12-15 LAB — PROCEDURE REPORT - SCANNED: PAP SMEAR: NEGATIVE

## 2015-12-15 LAB — URINE MICROSCOPIC-ADD ON

## 2015-12-15 NOTE — MAU Note (Signed)
Patient states she is sent over from Dr. Elsie StainMarshall's office, unable to obtain fetal heartrate in office today.

## 2015-12-15 NOTE — Discharge Instructions (Signed)

## 2015-12-15 NOTE — MAU Provider Note (Signed)
History   GP0101 @ 14.3 wks sent from Dr. Tawny HoppingMarshalls office for inability to find FHT at office visit. Pt denies any complaints.  CSN: [id number]  Arrival date & time 12/15/15  1632   None     No chief complaint on file.   HPI  Past Medical History  Diagnosis Date  . Pancreatitis, acute   . Migraines     Past Surgical History  Procedure Laterality Date  . Cholecystectomy      Family History  Problem Relation Age of Onset  . Hypertension Father   . Cancer Maternal Grandmother     brain, lung  . Cancer Paternal Grandmother     breast    Social History  Substance Use Topics  . Smoking status: Former Smoker    Types: Cigarettes    Quit date: 09/25/2010  . Smokeless tobacco: Never Used  . Alcohol Use: No    OB History    Gravida Para Term Preterm AB TAB SAB Ectopic Multiple Living   2 1  1      1       Review of Systems  Constitutional: Negative.   HENT: Negative.   Eyes: Negative.   Respiratory: Negative.   Cardiovascular: Negative.   Gastrointestinal: Negative.   Endocrine: Negative.   Genitourinary: Negative.   Musculoskeletal: Negative.   Skin: Negative.   Allergic/Immunologic: Negative.   Neurological: Negative.   Hematological: Negative.   Psychiatric/Behavioral: Negative.     Allergies  Duloxetine; Divalproex sodium; and Ibuprofen  Home Medications  No current outpatient prescriptions on file.  There were no vitals taken for this visit.  Physical Exam  Constitutional: She is oriented to person, place, and time. She appears well-developed and well-nourished.  HENT:  Head: Normocephalic.  Eyes: Pupils are equal, round, and reactive to light.  Neck: Normal range of motion.  Cardiovascular: Normal rate, regular rhythm, normal heart sounds and intact distal pulses.   Pulmonary/Chest: Effort normal and breath sounds normal.  Abdominal: Soft. Bowel sounds are normal.  Musculoskeletal: Normal range of motion.  Neurological: She is alert and  oriented to person, place, and time. She has normal reflexes.  Skin: Skin is warm and dry.  Psychiatric: She has a normal mood and affect. Her behavior is normal. Judgment and thought content normal.    MAU Course  Procedures (including critical care time)  Labs Reviewed  URINALYSIS, ROUTINE W REFLEX MICROSCOPIC (NOT AT [facility])   No results found.   No diagnosis found.    MDM  Fetus active per bedside u/s, FHT [address] and reg with doppler. Will d/c pt home.

## 2015-12-25 ENCOUNTER — Inpatient Hospital Stay (HOSPITAL_COMMUNITY)
Admission: AD | Admit: 2015-12-25 | Discharge: 2015-12-25 | Disposition: A | Discharge status: Home / Self Care | Payer: Medicaid Other | Source: Ambulatory Visit | Attending: Obstetrics | Admitting: Obstetrics

## 2015-12-25 ENCOUNTER — Encounter (HOSPITAL_COMMUNITY): Payer: Self-pay | Admitting: *Deleted

## 2015-12-25 DIAGNOSIS — O26892 Other specified pregnancy related conditions, second trimester: Secondary | ICD-10-CM | POA: Diagnosis not present

## 2015-12-25 DIAGNOSIS — Z3A15 15 weeks gestation of pregnancy: Secondary | ICD-10-CM | POA: Diagnosis not present

## 2015-12-25 DIAGNOSIS — R109 Unspecified abdominal pain: Secondary | ICD-10-CM | POA: Diagnosis not present

## 2015-12-25 DIAGNOSIS — Z87891 Personal history of nicotine dependence: Secondary | ICD-10-CM | POA: Diagnosis not present

## 2015-12-25 LAB — CBC
HCT: 33.5 % — ABNORMAL LOW (ref 36.0–46.0)
Hemoglobin: 10.9 g/dL — ABNORMAL LOW (ref 12.0–15.0)
MCH: 27.2 pg (ref 26.0–34.0)
MCHC: 32.5 g/dL (ref 30.0–36.0)
MCV: 83.5 fL (ref 78.0–100.0)
Platelets: 218 10*3/uL (ref 150–400)
RBC: 4.01 MIL/uL (ref 3.87–5.11)
RDW: 13.6 % (ref 11.5–15.5)
WBC: 9.1 10*3/uL (ref 4.0–10.5)

## 2015-12-25 LAB — COMPREHENSIVE METABOLIC PANEL
ALT: 16 U/L (ref 14–54)
AST: 12 U/L — ABNORMAL LOW (ref 15–41)
Albumin: 3 g/dL — ABNORMAL LOW (ref 3.5–5.0)
Alkaline Phosphatase: 101 U/L (ref 38–126)
Anion gap: 5 (ref 5–15)
BUN: 8 mg/dL (ref 6–20)
CO2: 24 mmol/L (ref 22–32)
Calcium: 8.6 mg/dL — ABNORMAL LOW (ref 8.9–10.3)
Chloride: 106 mmol/L (ref 101–111)
Creatinine, Ser: 0.53 mg/dL (ref 0.44–1.00)
GFR calc Af Amer: >60 mL/min (ref >60)
GFR calc non Af Amer: >60 mL/min (ref >60)
Glucose, Bld: 75 mg/dL (ref 65–99)
Potassium: 4.1 mmol/L (ref 3.5–5.1)
Sodium: 135 mmol/L (ref 135–145)
Total Bilirubin: 0.2 mg/dL — ABNORMAL LOW (ref 0.3–1.2)
Total Protein: 6.4 g/dL — ABNORMAL LOW (ref 6.5–8.1)

## 2015-12-25 LAB — URINALYSIS, ROUTINE W REFLEX MICROSCOPIC
Bilirubin Urine: NEGATIVE
GLUCOSE, UA: NEGATIVE mg/dL
Ketones, ur: NEGATIVE mg/dL
Leukocytes, UA: NEGATIVE
NITRITE: NEGATIVE
PH: 5.5 (ref 5.0–8.0)
Protein, ur: NEGATIVE mg/dL

## 2015-12-25 LAB — URINE MICROSCOPIC-ADD ON
BACTERIA UA: NONE SEEN
RBC / HPF: NONE SEEN RBC/hpf (ref 0–5)
WBC, UA: NONE SEEN WBC/hpf (ref 0–5)

## 2015-12-25 LAB — LIPASE, BLOOD: Lipase: 17 U/L (ref 11–51)

## 2015-12-25 LAB — AMYLASE: Amylase: 94 U/L (ref 28–100)

## 2015-12-25 MED ORDER — OXYCODONE-ACETAMINOPHEN 5-325 MG PO TABS
1.0000 | ORAL_TABLET | Freq: Once | ORAL | Status: AC
  Administered 2015-12-25: 1 via ORAL
  Filled 2015-12-25: qty 1

## 2015-12-25 NOTE — MAU Provider Note (Signed)
History     CSN: [id number]  Arrival date and time: 12/25/15 4098   First Provider Initiated Contact with Patient 12/25/15 [phone]      Chief Complaint  Patient presents with  . Abdominal Pain  . Vaginal Discharge  . Chills   HPI Sheri Cruz 30 y.o. [id number] @ [redacted]w[redacted]d presents to the MAU stating that she is having pain on her right side mid abdomen to lower abdomen.She can only get comfortable when she lays on her left side. She has a history of Pancreatitis 2 years ago and and had her gallbladder removed. She feels like this may be the same type of pain she felt at that time. Denies vaginal bleeding, contractions, lof.  Past Medical History  Diagnosis Date  . Pancreatitis, acute   . Migraines     Past Surgical History  Procedure Laterality Date  . Cholecystectomy      Family History  Problem Relation Age of Onset  . Hypertension Father   . Cancer Maternal Grandmother     brain, lung  . Cancer Paternal Grandmother     breast    Social History  Substance Use Topics  . Smoking status: Former Smoker    Types: Cigarettes    Quit date: 09/25/2010  . Smokeless tobacco: Never Used  . Alcohol Use: No    Allergies:  Allergies  Allergen Reactions  . Duloxetine Diarrhea  . Divalproex Sodium Hives  . Ibuprofen Hives, Itching and Rash    Prescriptions prior to admission  Medication Sig Dispense Refill Last Dose  . ondansetron (ZOFRAN ODT) 4 MG disintegrating tablet Take 1 tablet (4 mg total) by mouth every 8 (eight) hours as needed for refractory nausea / vomiting. 10 tablet 0 12/15/2015 at Unknown time  . Prenatal Vit-Fe Fumarate-FA (PRENATAL MULTIVITAMIN) TABS tablet Take 1 tablet by mouth daily at 12 noon.   12/15/2015 at Unknown time    Review of Systems  Constitutional: Negative for fever.  Gastrointestinal: Positive for abdominal pain. Negative for nausea and vomiting.  All other systems reviewed and are negative.  Physical Exam   Blood pressure 115/60, pulse 95,  temperature 98.2 F (36.8 C), resp. rate 18, last menstrual period 09/12/2015, unknown if currently breastfeeding.  Physical Exam  Nursing note and vitals reviewed. Constitutional: She is oriented to person, place, and time. She appears well-developed and well-nourished.  HENT:  Head: Normocephalic and atraumatic.  Cardiovascular: Normal rate and regular rhythm.   Respiratory: Effort normal. No respiratory distress.  GI: Soft. Bowel sounds are normal. There is tenderness.  RMQ and RLQ  Musculoskeletal: Normal range of motion.  Neurological: She is alert and oriented to person, place, and time.  Skin: Skin is warm and dry.  Psychiatric: She has a normal mood and affect. Her behavior is normal. Thought content normal.   Results for orders placed or performed during the hospital encounter of 12/25/15 (from the past 24 hour(s))  Urinalysis, Routine w reflex microscopic (not at [facility])     Status: Abnormal   Collection Time: 12/25/15  6:50 AM  Result Value Ref Range   Color, Urine YELLOW YELLOW   APPearance CLEAR CLEAR   Specific Gravity, Urine >1.030 (H) 1.005 - 1.030   pH 5.5 5.0 - 8.0   Glucose, UA NEGATIVE NEGATIVE mg/dL   Hgb urine dipstick TRACE (A) NEGATIVE   Bilirubin Urine NEGATIVE NEGATIVE   Ketones, ur NEGATIVE NEGATIVE mg/dL   Protein, ur NEGATIVE NEGATIVE mg/dL   Nitrite NEGATIVE NEGATIVE   Leukocytes,  UA NEGATIVE NEGATIVE  Urine microscopic-add on     Status: Abnormal   Collection Time: 12/25/15  6:50 AM  Result Value Ref Range   Squamous Epithelial / LPF 0-5 (A) NONE SEEN   WBC, UA NONE SEEN 0 - 5 WBC/hpf   RBC / HPF NONE SEEN 0 - 5 RBC/hpf   Bacteria, UA NONE SEEN NONE SEEN  CBC     Status: Abnormal   Collection Time: 12/25/15  8:13 AM  Result Value Ref Range   WBC 9.1 4.0 - 10.5 K/uL   RBC 4.01 3.87 - 5.11 MIL/uL   Hemoglobin 10.9 (L) 12.0 - 15.0 g/dL   HCT 45.433.5 (L) 09.836.0 - 11.946.0 %   MCV 83.5 78.0 - 100.0 fL   MCH 27.2 26.0 - 34.0 pg   MCHC 32.5 30.0 - 36.0  g/dL   RDW 14.713.6 82.911.5 - 56.215.5 %   Platelets 218 150 - 400 K/uL  Comprehensive metabolic panel     Status: Abnormal   Collection Time: 12/25/15  8:13 AM  Result Value Ref Range   Sodium 135 135 - 145 mmol/L   Potassium 4.1 3.5 - 5.1 mmol/L   Chloride 106 101 - 111 mmol/L   CO2 24 22 - 32 mmol/L   Glucose, Bld 75 65 - 99 mg/dL   BUN 8 6 - 20 mg/dL   Creatinine, Ser 1.300.53 0.44 - 1.00 mg/dL   Calcium 8.6 (L) 8.9 - 10.3 mg/dL   Total Protein 6.4 (L) 6.5 - 8.1 g/dL   Albumin 3.0 (L) 3.5 - 5.0 g/dL   AST 12 (L) 15 - 41 U/L   ALT 16 14 - 54 U/L   Alkaline Phosphatase 101 38 - 126 U/L   Total Bilirubin 0.2 (L) 0.3 - 1.2 mg/dL   GFR calc non Af Amer >60 >60 mL/min   GFR calc Af Amer >60 >60 mL/min   Anion gap 5 5 - 15  Amylase     Status: None   Collection Time: 12/25/15  8:13 AM  Result Value Ref Range   Amylase 94 28 - 100 U/L  Lipase, blood     Status: None   Collection Time: 12/25/15  8:13 AM  Result Value Ref Range   Lipase 17 11 - 51 U/L   MAU Course  Procedures  MDM Lab results wnl. Discussed POC with dr Gaynell FaceMarshall. Pt will follow up on Monday  Assessment and Plan  Abdominal pain in pregnancy  Discharge  Clemmons,Lori Grissett 12/25/2015, 9:21 AM

## 2015-12-25 NOTE — Discharge Instructions (Signed)

## 2015-12-25 NOTE — MAU Note (Signed)
Pt presents to MAU with complaints of lower abdominal cramping, clear vaginal discharge and chills. Denies any vaginal bleeding

## 2016-06-09 ENCOUNTER — Encounter: Payer: Self-pay | Admitting: *Deleted

## 2016-10-19 ENCOUNTER — Encounter (HOSPITAL_COMMUNITY): Payer: Self-pay

## 2017-11-25 IMAGING — US US OB TRANSVAGINAL
1 series · 14 of 28 positions shown · non-contrast
Comparison: None.

CLINICAL DATA: Pelvic cramping for 1 week

EXAM:
OBSTETRIC <14 WK US AND TRANSVAGINAL OB US
TECHNIQUE: Both transabdominal and transvaginal ultrasound examinations were
performed for complete evaluation of the gestation as well as the
maternal uterus, adnexal regions, and pelvic cul-de-sac.
Transvaginal technique was performed to assess early pregnancy.

[Series 1: us ob transvaginal · 0.23mm/px · 14 of 75 slices shown]
[im 3/75]
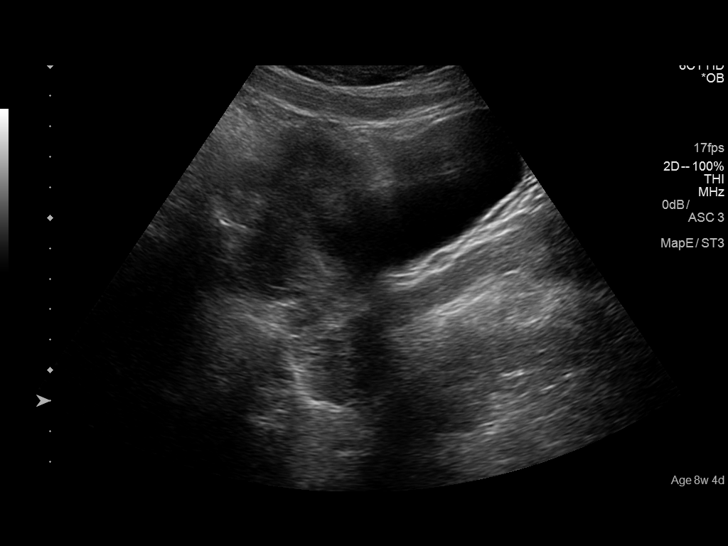
[im 9/75]
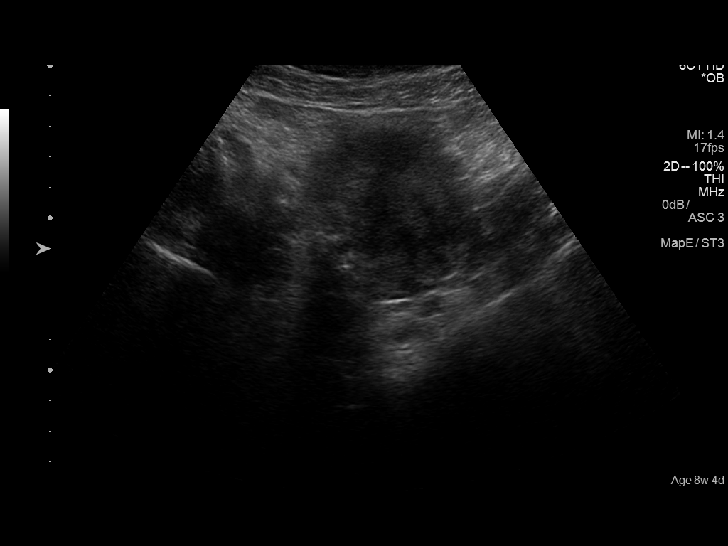
[im 14/75]
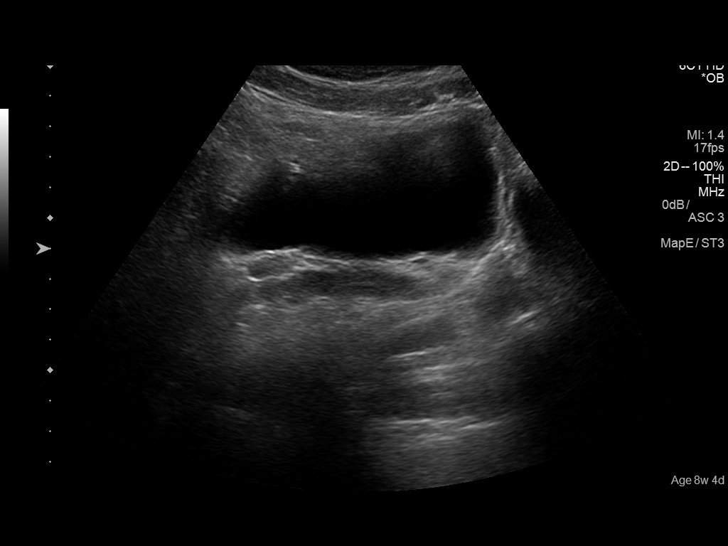
[im 20/75]
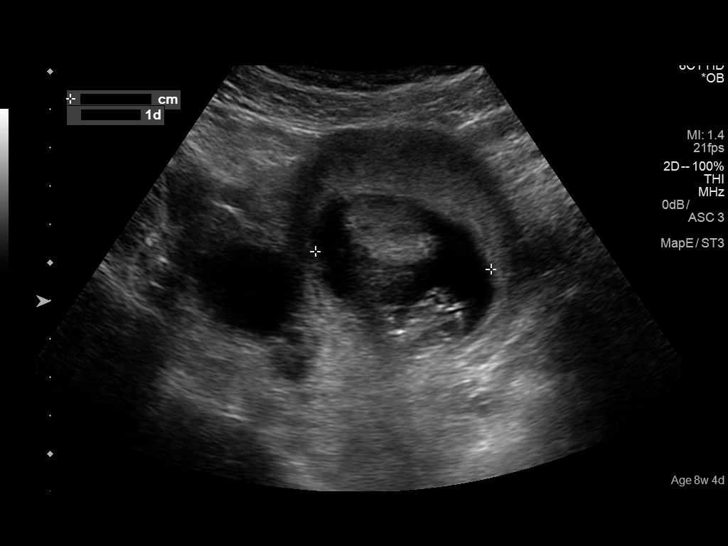
[im 25/75]
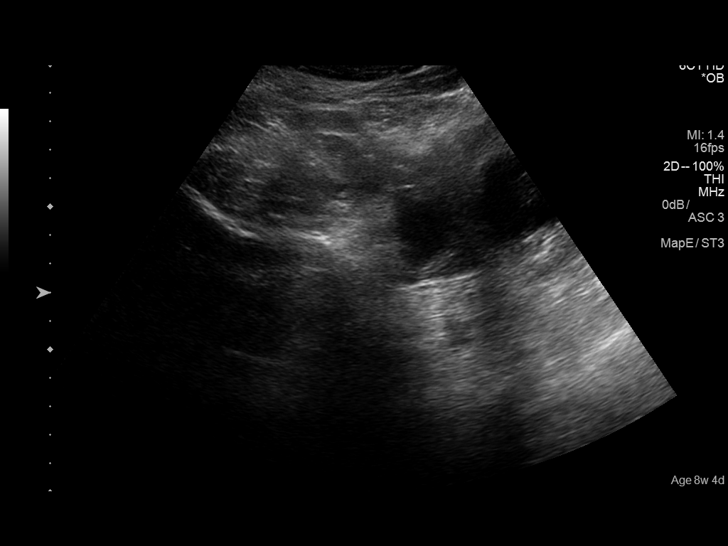
[im 31/75]
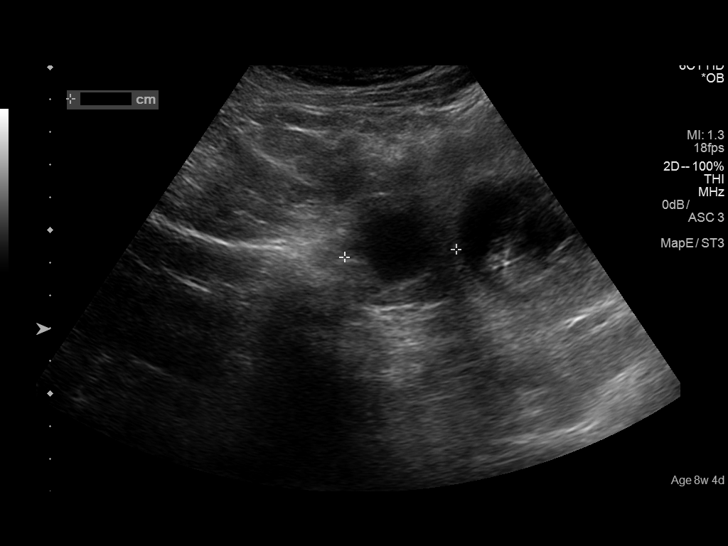
[im 36/75]
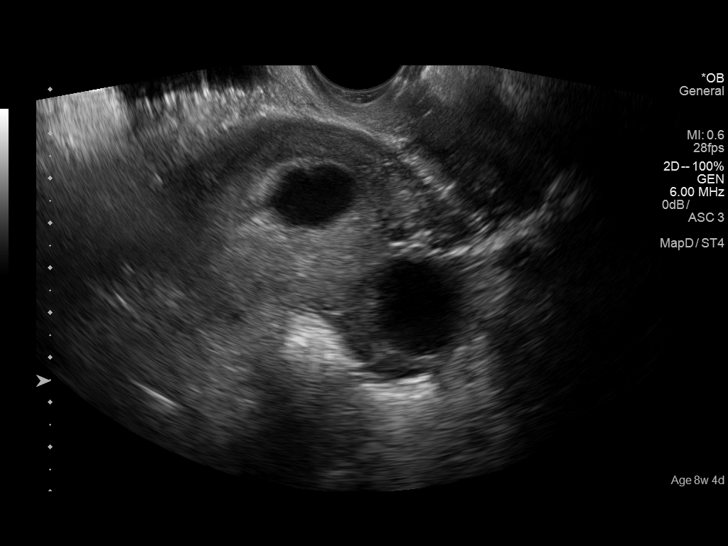
[im 42/75]
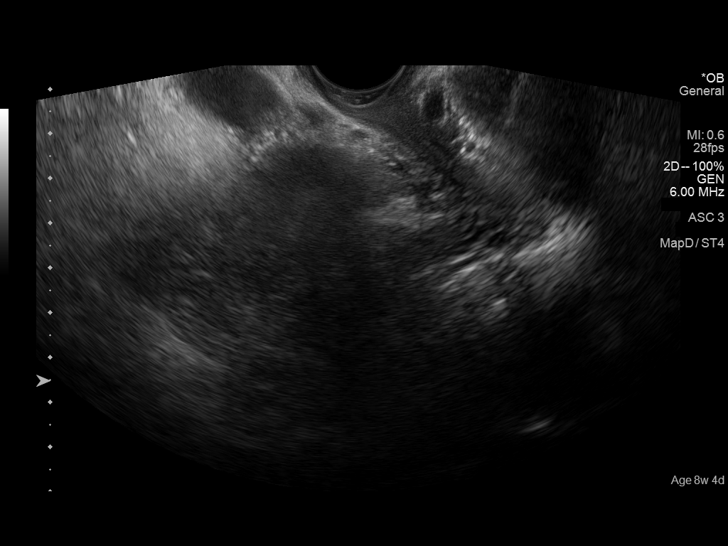
[im 47/75]
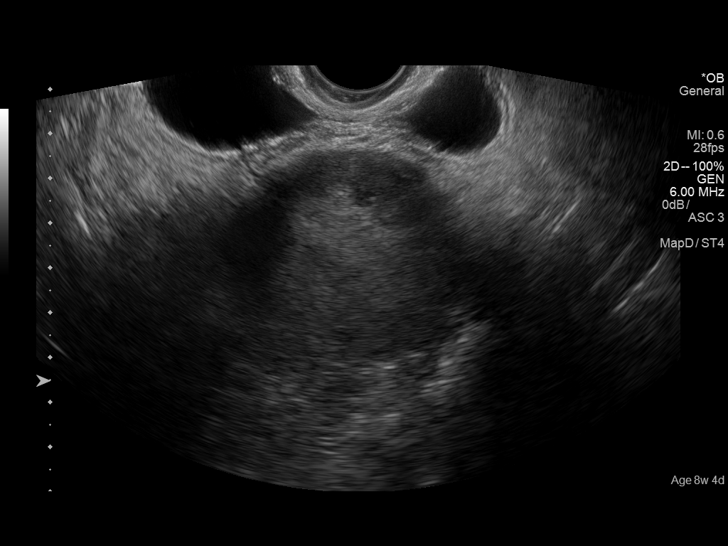
[im 53/75]
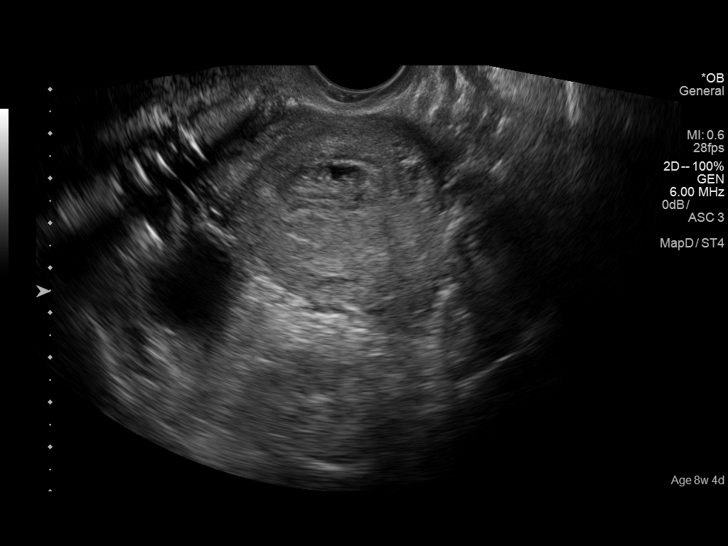
[im 58/75]
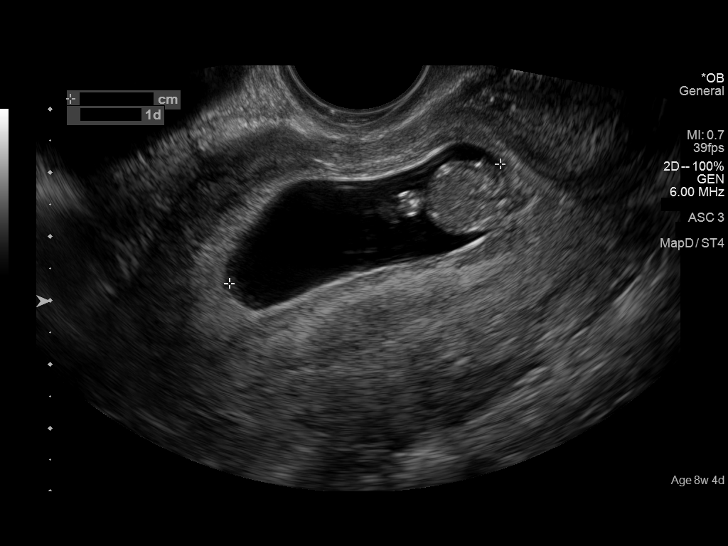
[im 64/75]
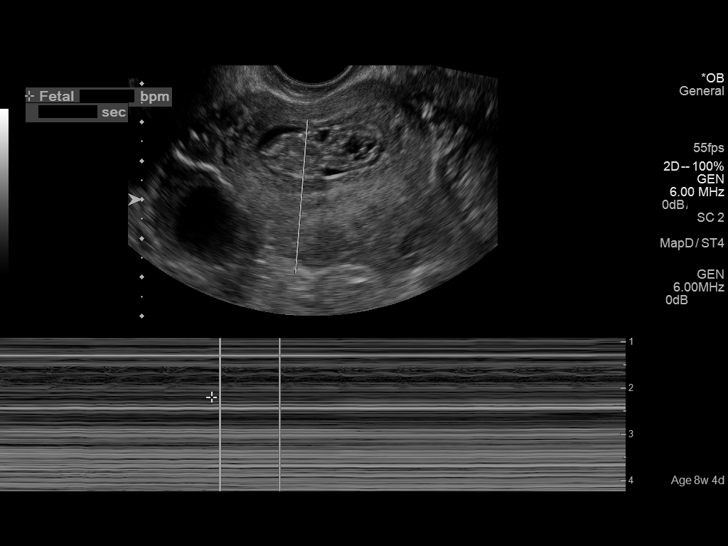
[im 69/75]
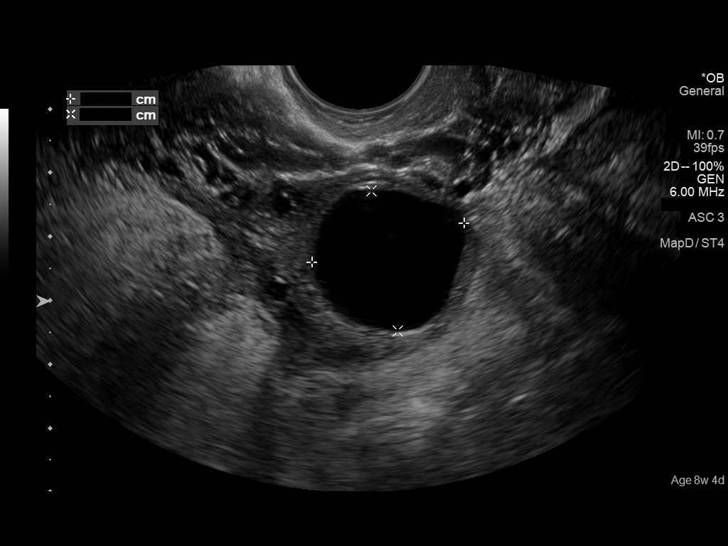
[im 75/75]
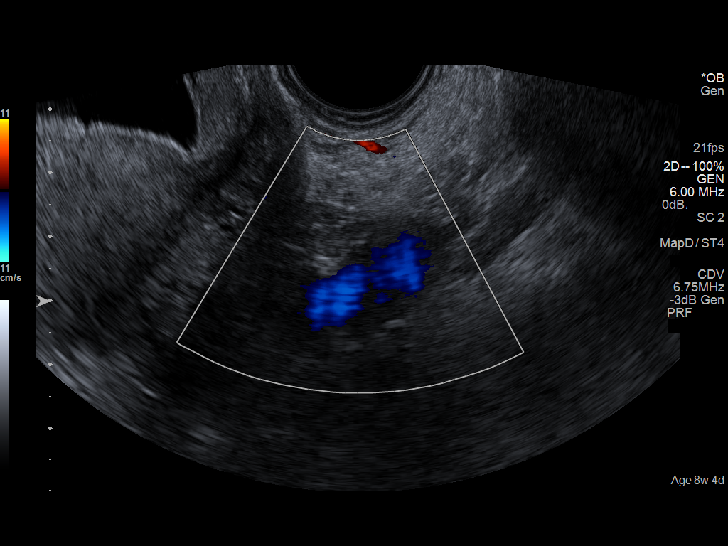

[14 of 28 positions shown; findings below may reference images not displayed]

FINDINGS: Intrauterine gestational sac: Visualized/normal in shape.

Yolk sac:  Not visualized

Embryo:  Visualized

Cardiac Activity: Visualized

Heart Rate: 171  bpm

MSD:   mm    w     d

CRL:  35  mm   10 w   3 d                  US EDC: 06/11/2016

Subchorionic hemorrhage:  None visualized.

Maternal uterus/adnexae: Right corpus luteum cyst. No adnexal
masses. No free fluid.
IMPRESSION: Ten week 3 day intrauterine pregnancy. Fetal heart rate 171 beats
per minute. No acute maternal findings.
